# Patient Record
Sex: Female | Born: 1985 | Race: Black or African American | Hispanic: No | Marital: Single | Smoking: Never smoker
Health system: Southern US, Community
[De-identification: ages and names within clinical notes are randomized; demographics above are authoritative.]

## PROBLEM LIST (undated history)

## (undated) ENCOUNTER — Inpatient Hospital Stay (HOSPITAL_COMMUNITY): Payer: Self-pay

## (undated) DIAGNOSIS — Z331 Pregnant state, incidental: Secondary | ICD-10-CM

## (undated) DIAGNOSIS — Z8619 Personal history of other infectious and parasitic diseases: Secondary | ICD-10-CM

## (undated) HISTORY — DX: Personal history of other infectious and parasitic diseases: Z86.19

## (undated) HISTORY — PX: NO PAST SURGERIES: SHX2092

---
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
## 2006-08-06 ENCOUNTER — Emergency Department (HOSPITAL_COMMUNITY): Admission: EM | Admit: 2006-08-06 | Discharge: 2006-08-06 | Payer: Self-pay | Admitting: Emergency Medicine

## 2006-09-05 ENCOUNTER — Emergency Department (HOSPITAL_COMMUNITY): Admission: EM | Admit: 2006-09-05 | Discharge: 2006-09-05 | Payer: Self-pay | Admitting: Emergency Medicine

## 2007-01-12 ENCOUNTER — Emergency Department (HOSPITAL_COMMUNITY): Admission: EM | Admit: 2007-01-12 | Discharge: 2007-01-12 | Payer: Self-pay | Admitting: Emergency Medicine

## 2007-01-13 ENCOUNTER — Emergency Department (HOSPITAL_COMMUNITY): Admission: EM | Admit: 2007-01-13 | Discharge: 2007-01-13 | Payer: Self-pay | Admitting: Emergency Medicine

## 2007-01-24 ENCOUNTER — Emergency Department (HOSPITAL_COMMUNITY): Admission: EM | Admit: 2007-01-24 | Discharge: 2007-01-24 | Payer: Self-pay | Admitting: Emergency Medicine

## 2007-04-28 ENCOUNTER — Emergency Department (HOSPITAL_COMMUNITY): Admission: EM | Admit: 2007-04-28 | Discharge: 2007-04-28 | Payer: Self-pay | Admitting: Family Medicine

## 2007-07-27 ENCOUNTER — Inpatient Hospital Stay (HOSPITAL_COMMUNITY): Admission: AD | Admit: 2007-07-27 | Discharge: 2007-07-27 | Payer: Self-pay | Admitting: Obstetrics and Gynecology

## 2007-08-02 ENCOUNTER — Emergency Department (HOSPITAL_COMMUNITY): Admission: EM | Admit: 2007-08-02 | Discharge: 2007-08-02 | Payer: Self-pay | Admitting: Emergency Medicine

## 2007-08-12 ENCOUNTER — Inpatient Hospital Stay (HOSPITAL_COMMUNITY): Admission: AD | Admit: 2007-08-12 | Discharge: 2007-08-12 | Payer: Self-pay | Admitting: Gynecology

## 2007-10-16 ENCOUNTER — Inpatient Hospital Stay (HOSPITAL_COMMUNITY): Admission: AD | Admit: 2007-10-16 | Discharge: 2007-10-17 | Payer: Self-pay | Admitting: Obstetrics & Gynecology

## 2007-12-13 ENCOUNTER — Inpatient Hospital Stay (HOSPITAL_COMMUNITY): Admission: AD | Admit: 2007-12-13 | Discharge: 2007-12-13 | Payer: Self-pay | Admitting: Obstetrics and Gynecology

## 2007-12-28 ENCOUNTER — Emergency Department (HOSPITAL_COMMUNITY): Admission: EM | Admit: 2007-12-28 | Discharge: 2007-12-28 | Payer: Self-pay | Admitting: Emergency Medicine

## 2008-01-23 ENCOUNTER — Inpatient Hospital Stay (HOSPITAL_COMMUNITY): Admission: AD | Admit: 2008-01-23 | Discharge: 2008-01-23 | Payer: Self-pay | Admitting: Obstetrics and Gynecology

## 2008-03-14 ENCOUNTER — Inpatient Hospital Stay (HOSPITAL_COMMUNITY): Admission: AD | Admit: 2008-03-14 | Discharge: 2008-03-15 | Payer: Self-pay | Admitting: Obstetrics and Gynecology

## 2008-03-20 ENCOUNTER — Inpatient Hospital Stay (HOSPITAL_COMMUNITY): Admission: AD | Admit: 2008-03-20 | Discharge: 2008-03-21 | Payer: Self-pay | Admitting: Obstetrics and Gynecology

## 2008-03-22 ENCOUNTER — Inpatient Hospital Stay (HOSPITAL_COMMUNITY): Admission: AD | Admit: 2008-03-22 | Discharge: 2008-03-23 | Payer: Self-pay | Admitting: Obstetrics and Gynecology

## 2008-03-24 ENCOUNTER — Inpatient Hospital Stay (HOSPITAL_COMMUNITY): Admission: RE | Admit: 2008-03-24 | Discharge: 2008-03-26 | Payer: Self-pay | Admitting: Obstetrics and Gynecology

## 2008-08-26 ENCOUNTER — Emergency Department (HOSPITAL_COMMUNITY): Admission: EM | Admit: 2008-08-26 | Discharge: 2008-08-26 | Payer: Self-pay | Admitting: Family Medicine

## 2009-06-20 ENCOUNTER — Emergency Department (HOSPITAL_COMMUNITY): Admission: EM | Admit: 2009-06-20 | Discharge: 2009-06-20 | Payer: Self-pay | Admitting: Emergency Medicine

## 2009-08-04 ENCOUNTER — Emergency Department (HOSPITAL_COMMUNITY): Admission: EM | Admit: 2009-08-04 | Discharge: 2009-08-04 | Payer: Self-pay | Admitting: Emergency Medicine

## 2009-12-12 ENCOUNTER — Emergency Department (HOSPITAL_COMMUNITY): Admission: EM | Admit: 2009-12-12 | Discharge: 2009-12-12 | Payer: Self-pay | Admitting: Family Medicine

## 2010-03-28 ENCOUNTER — Emergency Department (HOSPITAL_COMMUNITY): Admission: EM | Admit: 2010-03-28 | Discharge: 2010-03-28 | Payer: Self-pay | Admitting: Emergency Medicine

## 2010-04-04 ENCOUNTER — Ambulatory Visit: Payer: Self-pay | Admitting: Nurse Practitioner

## 2010-04-04 ENCOUNTER — Inpatient Hospital Stay (HOSPITAL_COMMUNITY): Admission: AD | Admit: 2010-04-04 | Discharge: 2010-04-04 | Payer: Self-pay | Admitting: Obstetrics and Gynecology

## 2010-07-21 ENCOUNTER — Ambulatory Visit: Payer: Self-pay | Admitting: Family

## 2010-07-21 ENCOUNTER — Inpatient Hospital Stay (HOSPITAL_COMMUNITY): Admission: AD | Admit: 2010-07-21 | Discharge: 2010-07-21 | Payer: Self-pay | Admitting: Obstetrics & Gynecology

## 2010-12-06 LAB — URINALYSIS, ROUTINE W REFLEX MICROSCOPIC
Glucose, UA: NEGATIVE mg/dL
Leukocytes, UA: NEGATIVE
pH: 6.5 (ref 5.0–8.0)

## 2010-12-06 LAB — URINE MICROSCOPIC-ADD ON

## 2010-12-06 LAB — CBC
HCT: 40.5 % (ref 36.0–46.0)
Hemoglobin: 13.4 g/dL (ref 12.0–15.0)
MCV: 88.5 fL (ref 78.0–100.0)
RBC: 4.57 MIL/uL (ref 3.87–5.11)
WBC: 6.4 10*3/uL (ref 4.0–10.5)

## 2010-12-06 LAB — POCT PREGNANCY, URINE: Preg Test, Ur: POSITIVE

## 2010-12-06 LAB — GC/CHLAMYDIA PROBE AMP, GENITAL: Chlamydia, DNA Probe: NEGATIVE

## 2010-12-06 LAB — ABO/RH: ABO/RH(D): A POS

## 2010-12-10 LAB — URINALYSIS, ROUTINE W REFLEX MICROSCOPIC
Glucose, UA: NEGATIVE mg/dL
Ketones, ur: NEGATIVE mg/dL
Nitrite: NEGATIVE
Protein, ur: NEGATIVE mg/dL
Specific Gravity, Urine: 1.01 (ref 1.005–1.030)
Specific Gravity, Urine: 1.022 (ref 1.005–1.030)
Urobilinogen, UA: 1 mg/dL (ref 0.0–1.0)
pH: 7 (ref 5.0–8.0)

## 2010-12-10 LAB — ABO/RH: ABO/RH(D): A POS

## 2010-12-10 LAB — CBC
HCT: 40.6 % (ref 36.0–46.0)
MCHC: 33.7 g/dL (ref 30.0–36.0)
Platelets: 252 10*3/uL (ref 150–400)
RDW: 14.3 % (ref 11.5–15.5)

## 2010-12-10 LAB — WET PREP, GENITAL
Trich, Wet Prep: NONE SEEN
Yeast Wet Prep HPF POC: NONE SEEN

## 2010-12-10 LAB — URINE MICROSCOPIC-ADD ON

## 2010-12-10 LAB — URINE CULTURE: Colony Count: NO GROWTH

## 2010-12-10 LAB — POCT PREGNANCY, URINE: Preg Test, Ur: POSITIVE

## 2010-12-27 LAB — URINALYSIS, ROUTINE W REFLEX MICROSCOPIC
Glucose, UA: NEGATIVE mg/dL
Hgb urine dipstick: NEGATIVE
pH: 6.5 (ref 5.0–8.0)

## 2010-12-27 LAB — URINE MICROSCOPIC-ADD ON

## 2010-12-29 LAB — POCT URINALYSIS DIP (DEVICE)
Bilirubin Urine: NEGATIVE
Hgb urine dipstick: NEGATIVE
Specific Gravity, Urine: 1.02 (ref 1.005–1.030)
pH: 7 (ref 5.0–8.0)

## 2011-01-15 ENCOUNTER — Emergency Department (HOSPITAL_COMMUNITY)
Admission: EM | Admit: 2011-01-15 | Discharge: 2011-01-16 | Disposition: A | Discharge status: Home / Self Care | Payer: Medicaid Other | Attending: Emergency Medicine | Admitting: Emergency Medicine

## 2011-01-15 DIAGNOSIS — R350 Frequency of micturition: Secondary | ICD-10-CM | POA: Insufficient documentation

## 2011-01-15 DIAGNOSIS — R51 Headache: Secondary | ICD-10-CM | POA: Insufficient documentation

## 2011-01-15 DIAGNOSIS — R631 Polydipsia: Secondary | ICD-10-CM | POA: Insufficient documentation

## 2011-01-15 LAB — URINE MICROSCOPIC-ADD ON

## 2011-01-15 LAB — URINALYSIS, ROUTINE W REFLEX MICROSCOPIC
Ketones, ur: NEGATIVE mg/dL
Nitrite: NEGATIVE
Specific Gravity, Urine: 1.019 (ref 1.005–1.030)
pH: 6.5 (ref 5.0–8.0)

## 2011-01-15 LAB — POCT PREGNANCY, URINE: Preg Test, Ur: NEGATIVE

## 2011-02-06 NOTE — Discharge Summary (Signed)
Judy Tate, Judy Tate              ACCOUNT NO.:  [id number]   MEDICAL RECORD NO.:  [id number]          PATIENT TYPE:  INP   LOCATION:  9123                          FACILITY:  [facility]   PHYSICIAN:  Sherron Monday, MD        DATE OF BIRTH:  24-Jan-1986   DATE OF ADMISSION:  03/24/2008  DATE OF DISCHARGE:  03/26/2008                               DISCHARGE SUMMARY   ADMITTING DIAGNOSIS:  Intrauterine pregnancy at term, for induction of  labor.   DISCHARGE DIAGNOSIS:  Infant delivered via spontaneous vaginal delivery.   HISTORY OF PRESENT ILLNESS:  A 25 year old G2, P1-0-0-1 at 15 plus weeks  with an [facility] of March 29, 2008, by last menstrual period consistent with 19-  week ultrasound presents to labor and delivery for induction given term  status and favorable cervix.  Prenatal care was somewhat late and  starting at 19 to 22 weeks, but otherwise uneventful.   PAST MEDICAL HISTORY:  Nonsignificant.   PAST SURGICAL HISTORY:  Nonsignificant.   PAST OB/GYN HISTORY:  G1 was a term delivery in 2007, 5 pound 2 ounce  infant.  G2 is her current pregnancy.  History of an abnormal Pap smear  that has since resolved.  No history of any sexual transmitted diseases.   MEDICATIONS:  Include prenatal vitamins.   ALLERGIES:  No known drug allergies.   SOCIAL HISTORY:  The patient denies alcohol, tobacco, or drug use.   PRENATAL LABS:  A positive.  Antibody screen negative.  Sickle cell  within normal limits, RPR nonreactive, rubella immune, Hepatitis C  surface antigen negative, HIV negative.  Gonorrhea negative and  Chlamydia negative.  Group B Strep negative.  One-hour Glucola 59.  She  was too late for any screens.   PHYSICAL EXAM:  On admission, she was afebrile.  Vital signs were stable  and a benign exam.  Her cervix was found to be 2+ cm dilated, 50%  effaced, and -2 station.  Her membranes were ruptured from mild-to-  moderate meconium and started on Pitocin to augment her labor.  Her  intrapartum course was relatively uncomplicated.  An IUPC was placed for  an amnio-infusion secondary to the meconium.  She progressed to complete  +2, pushed well for a vaginal delivery of a vigorous female infant over  an intact perineum with Apgars of 8 at one minute and 9 at five minutes,  was DeLee suctioned on the perineum for a light meconium fluid and  weight is 7 pounds 1 ounces.  Placenta was delivered intact and  spontaneously with a small vaginal abrasion, which was repaired with 2-0  Vicryl.  Cervix and rectum were intact.  The patient's EBL was  approximately 450 mL.  Her postpartum course was relatively  uncomplicated.  She remained afebrile with vitals stable throughout.  Her fundus was firm and she was without complaints.  She was discharged  home on postpartum day #2, ambulating well, and tolerating diet without  any problems.  She was discharged to home with routine discharge  instructions and is to call with any questions or  problems  with prescriptions for Motrin, Vicodin, and prenatal vitamins.  She plans to breastfeed and bottle feed.  Her hemoglobin decreased from  11.0 to 9.7.  She will discuss her contraception with Dr. Senaida Ores at  her postpartum checkup in 6 weeks.      Sherron Monday, MD  Electronically Signed     JB/MEDQ  D:  03/26/2008  T:  03/27/2008  Job:  [id number]

## 2011-02-11 ENCOUNTER — Inpatient Hospital Stay (HOSPITAL_COMMUNITY)
Admission: AD | Admit: 2011-02-11 | Discharge: 2011-02-11 | Disposition: A | Discharge status: Home / Self Care | Payer: Medicaid Other | Source: Ambulatory Visit | Attending: Obstetrics & Gynecology | Admitting: Obstetrics & Gynecology

## 2011-02-11 DIAGNOSIS — O99891 Other specified diseases and conditions complicating pregnancy: Secondary | ICD-10-CM | POA: Insufficient documentation

## 2011-02-11 DIAGNOSIS — J40 Bronchitis, not specified as acute or chronic: Secondary | ICD-10-CM | POA: Insufficient documentation

## 2011-02-11 DIAGNOSIS — O9989 Other specified diseases and conditions complicating pregnancy, childbirth and the puerperium: Secondary | ICD-10-CM

## 2011-02-19 ENCOUNTER — Inpatient Hospital Stay (HOSPITAL_COMMUNITY): Payer: Medicaid Other

## 2011-02-19 ENCOUNTER — Inpatient Hospital Stay (HOSPITAL_COMMUNITY)
Admission: EM | Admit: 2011-02-19 | Discharge: 2011-02-19 | Disposition: A | Discharge status: Home / Self Care | Payer: Medicaid Other | Source: Ambulatory Visit | Attending: Obstetrics and Gynecology | Admitting: Obstetrics and Gynecology

## 2011-02-19 ENCOUNTER — Encounter (HOSPITAL_COMMUNITY): Payer: Self-pay | Admitting: Radiology

## 2011-02-19 DIAGNOSIS — O209 Hemorrhage in early pregnancy, unspecified: Secondary | ICD-10-CM | POA: Insufficient documentation

## 2011-02-19 DIAGNOSIS — R58 Hemorrhage, not elsewhere classified: Secondary | ICD-10-CM

## 2011-02-19 LAB — WET PREP, GENITAL: Clue Cells Wet Prep HPF POC: NONE SEEN

## 2011-02-19 LAB — CBC
Hemoglobin: 12.3 g/dL (ref 12.0–15.0)
MCH: 27.6 pg (ref 26.0–34.0)
RBC: 4.46 MIL/uL (ref 3.87–5.11)

## 2011-02-19 LAB — ABO/RH: ABO/RH(D): A POS

## 2011-02-20 LAB — GC/CHLAMYDIA PROBE AMP, GENITAL: Chlamydia, DNA Probe: NEGATIVE

## 2011-02-21 ENCOUNTER — Inpatient Hospital Stay (HOSPITAL_COMMUNITY)
Admission: AD | Admit: 2011-02-21 | Discharge: 2011-02-21 | Disposition: A | Discharge status: Home / Self Care | Payer: Medicaid Other | Source: Ambulatory Visit | Attending: Obstetrics and Gynecology | Admitting: Obstetrics and Gynecology

## 2011-02-21 DIAGNOSIS — O039 Complete or unspecified spontaneous abortion without complication: Secondary | ICD-10-CM | POA: Insufficient documentation

## 2011-02-21 LAB — HCG, QUANTITATIVE, PREGNANCY: hCG, Beta Chain, Quant, S: 34 m[IU]/mL — ABNORMAL HIGH (ref <5)

## 2011-05-06 IMAGING — US US OB COMP LESS 14 WK
1 series · 14 of 28 positions shown · non-contrast
Comparison: ultrasound 04/04/2010

CLINICAL DATA: Bleeding, estimated gestational age by last
menstrual period equals 7 weeks 0 days.

OBSTETRIC <14 WK ULTRASOUND
TECHNIQUE: Transabdominal ultrasound was performed for evaluation
of the gestation as well as the maternal uterus and adnexal
regions.

[Series 1: us ob comp less 14 wks · 0.17mm/px · 32 acquisitions, 14 frames shown]
[im 2/32]
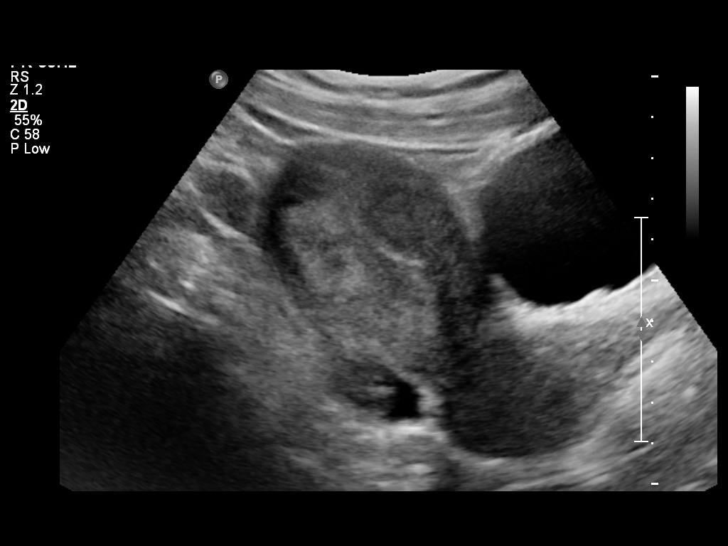
[im 4/32]
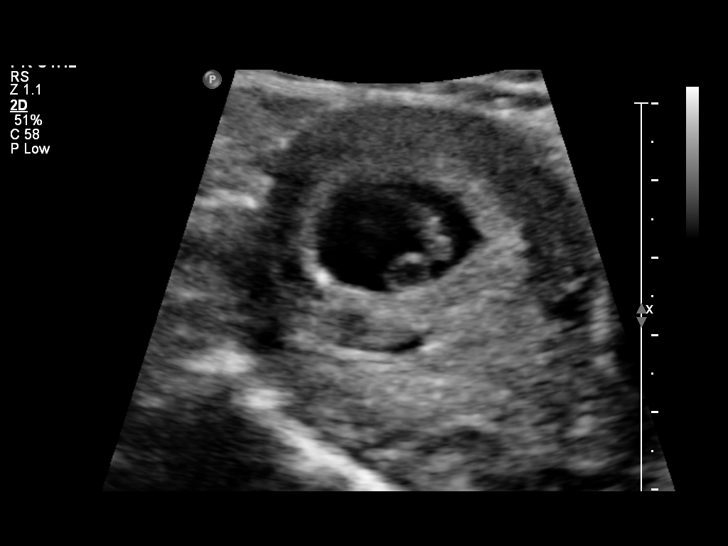
[im 6/32]
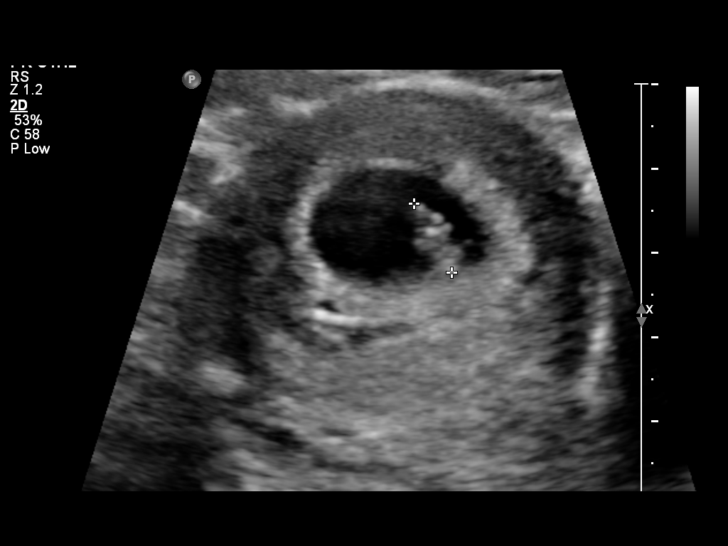
[im 9/32]
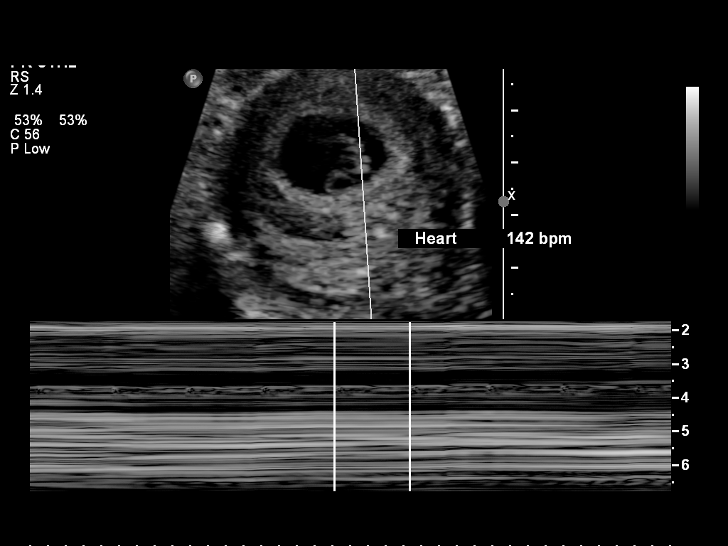
[im 11/32]
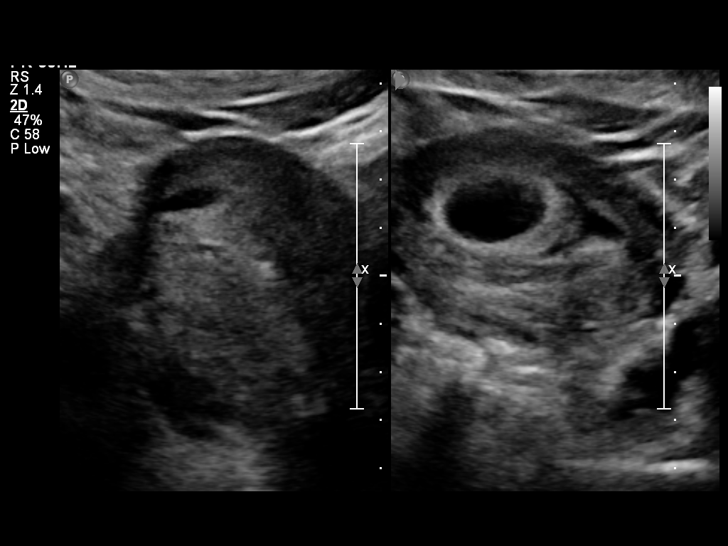
[im 13/32]
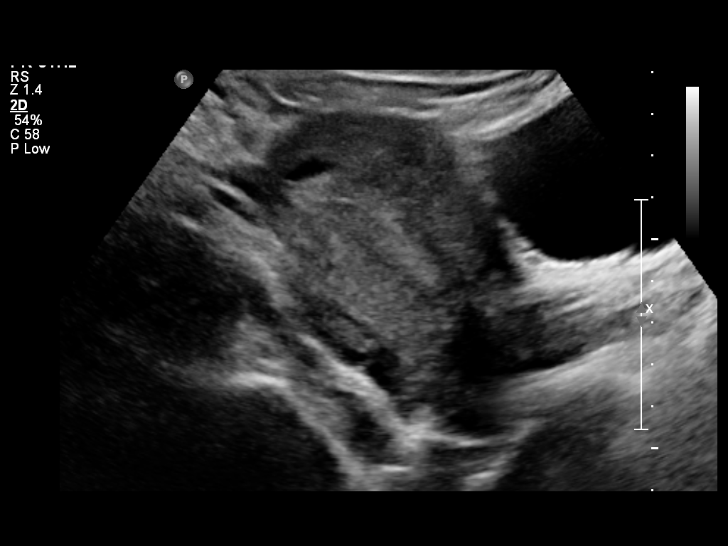
[im 15/32]
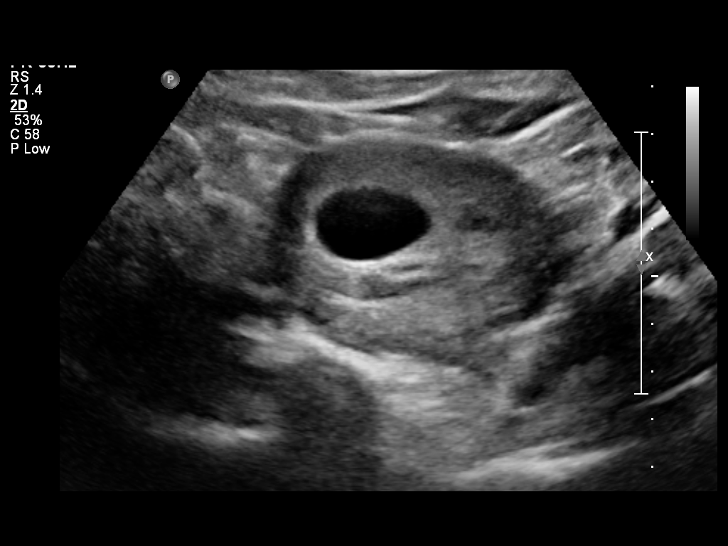
[im 18/32]
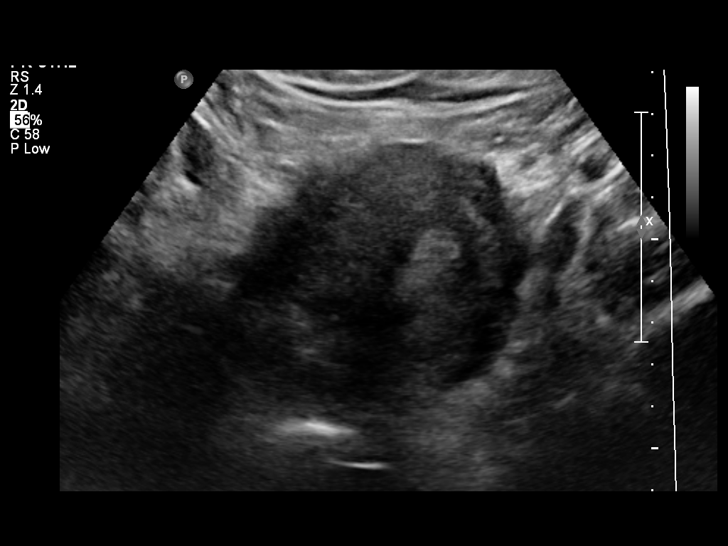
[im 20/32]
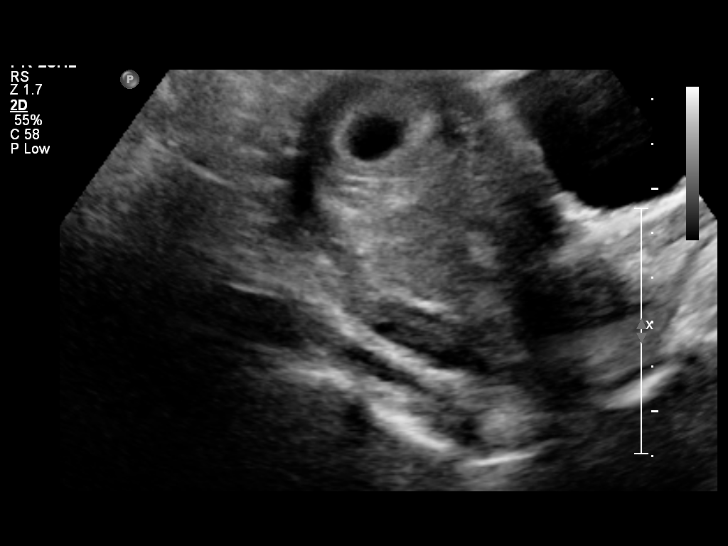
[im 22/32]
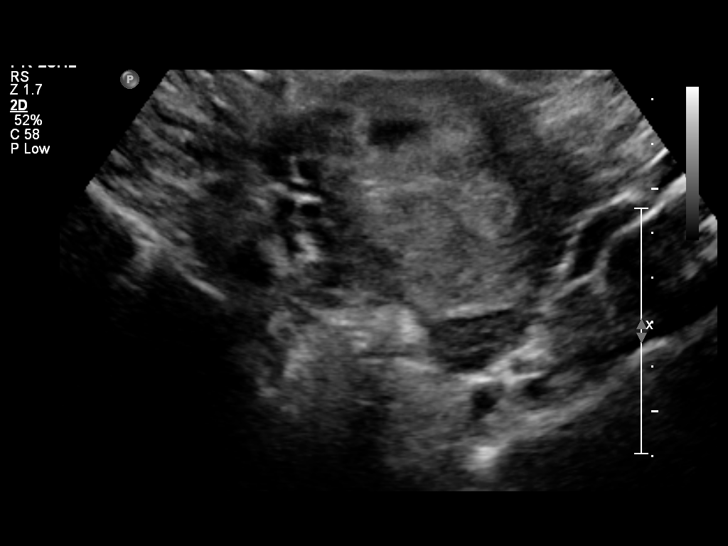
[im 25/32]
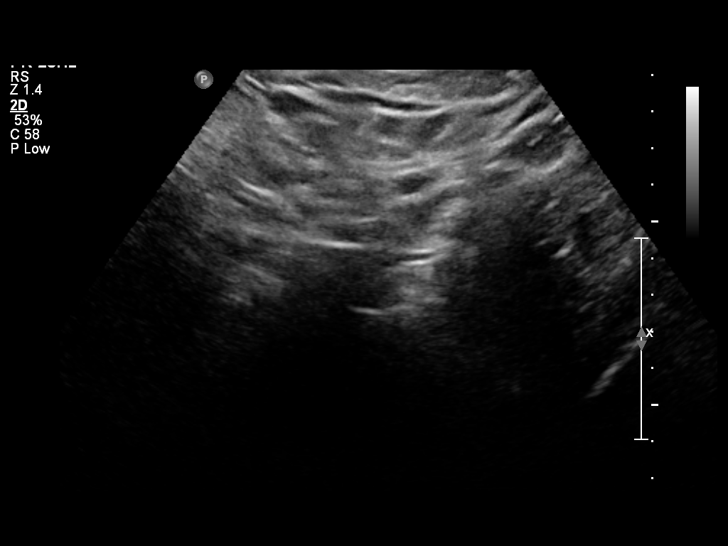
[im 27/32]
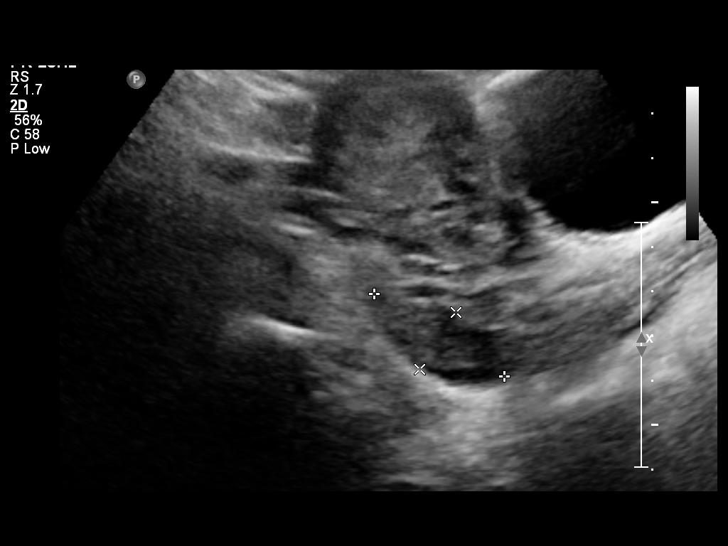
[im 29/32]
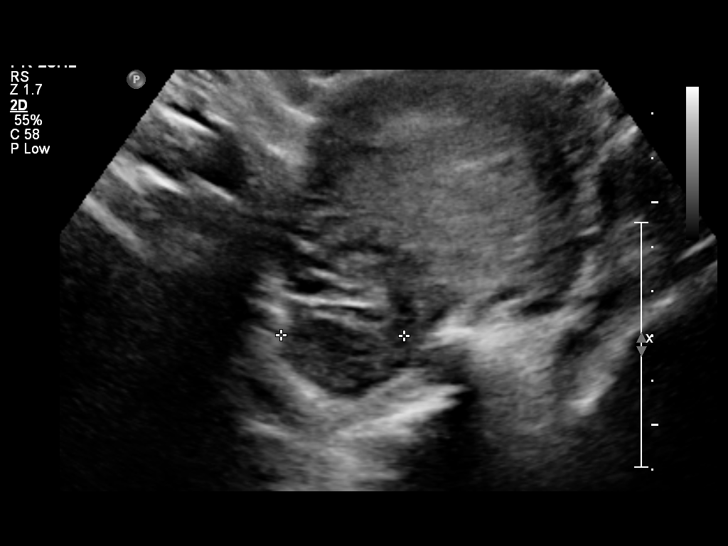
[im 32/32]
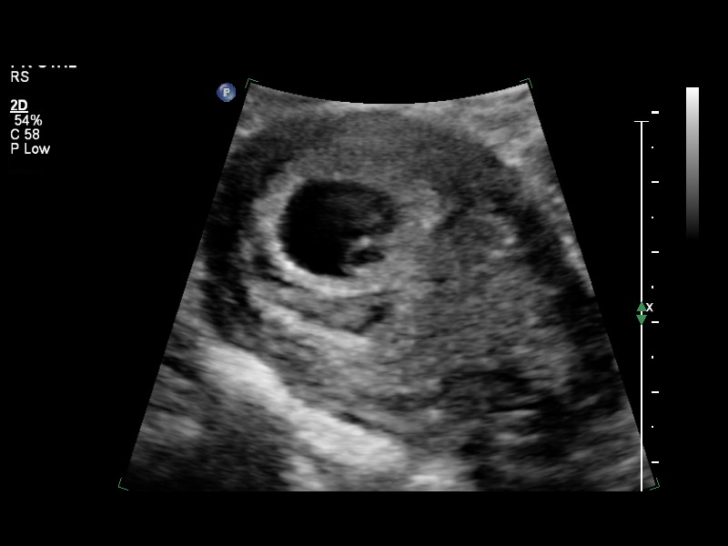

[14 of 28 positions shown; findings below may reference images not displayed]

Intrauterine gestational sac: single
Yolk sac: Present
Embryo: Present
Cardiac Activity: Present
Heart Rate: 142 bpm

CRL:  10.3 mm  7w  1d         US EDC: 03/09/2011

Maternal uterus/Adnexae:
Small subchorionic hemorrhage is noted.  Corpus luteal cyst in the
right ovary.  Left ovary is normal.  No free fluid.
IMPRESSION: 1.  Single intrauterine gestational sac with embryo and normal
cardiac activity.
2.  Small subchorionic hemorrhage.
3.  Estimated gestational age by crown-rump length equals 7 weeks 1
day.

## 2011-06-18 ENCOUNTER — Inpatient Hospital Stay (HOSPITAL_COMMUNITY)
Admission: AD | Admit: 2011-06-18 | Discharge: 2011-06-18 | Disposition: A | Discharge status: Home / Self Care | Payer: Medicaid Other | Source: Ambulatory Visit | Attending: Obstetrics and Gynecology | Admitting: Obstetrics and Gynecology

## 2011-06-18 ENCOUNTER — Encounter (HOSPITAL_COMMUNITY): Payer: Self-pay | Admitting: *Deleted

## 2011-06-18 DIAGNOSIS — N644 Mastodynia: Secondary | ICD-10-CM | POA: Insufficient documentation

## 2011-06-18 LAB — URINALYSIS, ROUTINE W REFLEX MICROSCOPIC
Bilirubin Urine: NEGATIVE
Glucose, UA: NEGATIVE
Ketones, ur: NEGATIVE
Nitrite: NEGATIVE
Protein, ur: NEGATIVE
pH: 7

## 2011-06-18 NOTE — Progress Notes (Signed)
Pt seen leaving by another employee.  Pt left after having blood drawn.  No vitals or e-sing done.

## 2011-06-18 NOTE — ED Provider Notes (Signed)
History   The patient is a 25 y.o. year old G63P2012 female who presents to MAU reporting sore breast x 2 weeks. Her LMP on 06/03/11 was short and light. She thinks she may be pregnant and is very anxious because she had an SAB in may 2012 and because her mother had breast cancer. She denies breast masses, nipple retraction or discharge or cyclic nature to her breast soreness.   CSN: [id number] Arrival date & time: 06/18/2011  3:26 PM  Chief Complaint  Patient presents with  . Breast Pain    HPI  (Consider location/radiation/quality/duration/timing/severity/associated sxs/prior treatment)  HPI  Past Medical History  Diagnosis Date  . No pertinent past medical history     Past Surgical History  Procedure Date  . No past surgeries     No family history on file.  History  Substance Use Topics  . Smoking status: Never Smoker   . Smokeless tobacco: Not on file  . Alcohol Use: No    OB History    Grav Para Term Preterm Abortions TAB SAB Ect Mult Living   4 2 2  0 1 0 1 0 0 2      Review of Systems  Review of Systems: Otherwise neg  Allergies  Review of patient's allergies indicates no known allergies.  Home Medications  No current outpatient prescriptions on file.  Physical Exam    BP 112/62  Pulse 61  Temp(Src) 99 F (37.2 C) (Oral)  Resp 16  Ht 5' 3.5" (1.613 m)  Wt 77.111 kg (170 lb)  BMI 29.64 kg/m2  LMP 06/03/2011  Breastfeeding? No  Physical Exam Breast: Skin smooth, no masses. Nipples erect, no discharge. No lymphadenopathy. Nipples mildly tender to palpation.  Results for orders placed during the hospital encounter of 06/18/11 (from the past 24 hour(s))  POCT PREGNANCY, URINE     Status: Normal   Collection Time   06/18/11  5:12 PM      Component Value Range   Preg Test, Ur NEGATIVE    HCG, QUANTITATIVE, PREGNANCY     Status: Normal   Collection Time   06/18/11 10:45 PM      Component Value Range   hCG, Beta Chain, Quant, S 1  <5 (mIU/mL)     ED Course  Procedures (including critical care time)   Labs Reviewed  POCT PREGNANCY, URINE  POCT URINE PREGNANCY  HCG, QUANTITATIVE, PREGNANCY   Assessment: 1. Breast tenderness of unknown etiology. Normal exam.  Plan: 1. Pt left w/out paperwork 2. Called w/ results 3. Dr. Senaida Ores notified of visit and results. 4. F/U PRN for worsening Sx.

## 2011-06-18 NOTE — Progress Notes (Signed)
V. Smith, CNM at bedside.  Assessment done and poc discussed with pt.  

## 2011-06-18 NOTE — Progress Notes (Signed)
Pt states, " I had an unusual 2 days period this month and now my breast are sore."

## 2011-06-18 NOTE — Progress Notes (Signed)
NP explained to pt that she could get dressed and leave after lab work done and paper work signed.  Entered room to see pt after and she was gone.  Other employee had seen pt ambulating out with no s/s of distress.

## 2011-06-19 NOTE — Progress Notes (Signed)
Called beta result to pt per CNM request.

## 2011-06-21 LAB — CBC
HCT: 29.6 — ABNORMAL LOW
HCT: 33.4 — ABNORMAL LOW
Hemoglobin: 9.7 — ABNORMAL LOW
MCHC: 32.9
MCHC: 33
MCV: 79
Platelets: 240
RBC: 4.23
RDW: 15.6 — ABNORMAL HIGH
WBC: 7.6

## 2011-07-03 LAB — POCT PREGNANCY, URINE
Operator id: 271521
Preg Test, Ur: POSITIVE

## 2011-07-03 LAB — URINE MICROSCOPIC-ADD ON

## 2011-07-03 LAB — HERPES SIMPLEX VIRUS CULTURE: Culture: DETECTED

## 2011-07-03 LAB — WET PREP, GENITAL
Clue Cells Wet Prep HPF POC: NONE SEEN
Trich, Wet Prep: NONE SEEN

## 2011-07-03 LAB — URINALYSIS, ROUTINE W REFLEX MICROSCOPIC
Bilirubin Urine: NEGATIVE
Bilirubin Urine: NEGATIVE
Glucose, UA: NEGATIVE
Glucose, UA: NEGATIVE
Hgb urine dipstick: NEGATIVE
Hgb urine dipstick: NEGATIVE
Ketones, ur: 15 — AB
Ketones, ur: 15 — AB
Ketones, ur: NEGATIVE
Leukocytes, UA: NEGATIVE
Protein, ur: NEGATIVE
Protein, ur: NEGATIVE
Urobilinogen, UA: 0.2
pH: 7.5

## 2011-07-03 LAB — GC/CHLAMYDIA PROBE AMP, GENITAL: Chlamydia, DNA Probe: NEGATIVE

## 2011-07-09 LAB — POCT URINALYSIS DIP (DEVICE)
Hgb urine dipstick: NEGATIVE
Protein, ur: 100 — AB
Specific Gravity, Urine: >=1.03
Urobilinogen, UA: 2 — ABNORMAL HIGH
pH: 6

## 2011-07-09 LAB — HERPES SIMPLEX VIRUS CULTURE

## 2011-07-09 LAB — WET PREP, GENITAL: Clue Cells Wet Prep HPF POC: NONE SEEN

## 2011-08-20 ENCOUNTER — Inpatient Hospital Stay (HOSPITAL_COMMUNITY): Payer: Medicaid Other

## 2011-08-20 ENCOUNTER — Encounter (HOSPITAL_COMMUNITY): Payer: Self-pay | Admitting: *Deleted

## 2011-08-20 ENCOUNTER — Inpatient Hospital Stay (HOSPITAL_COMMUNITY)
Admission: AD | Admit: 2011-08-20 | Discharge: 2011-08-20 | Disposition: A | Discharge status: Home / Self Care | Payer: Medicaid Other | Source: Ambulatory Visit | Attending: Obstetrics and Gynecology | Admitting: Obstetrics and Gynecology

## 2011-08-20 DIAGNOSIS — Z363 Encounter for antenatal screening for malformations: Secondary | ICD-10-CM

## 2011-08-20 DIAGNOSIS — O99891 Other specified diseases and conditions complicating pregnancy: Secondary | ICD-10-CM | POA: Insufficient documentation

## 2011-08-20 DIAGNOSIS — R109 Unspecified abdominal pain: Secondary | ICD-10-CM | POA: Insufficient documentation

## 2011-08-20 DIAGNOSIS — Z3490 Encounter for supervision of normal pregnancy, unspecified, unspecified trimester: Secondary | ICD-10-CM

## 2011-08-20 DIAGNOSIS — Z1389 Encounter for screening for other disorder: Secondary | ICD-10-CM

## 2011-08-20 LAB — HCG, QUANTITATIVE, PREGNANCY: hCG, Beta Chain, Quant, S: 47397 m[IU]/mL — ABNORMAL HIGH (ref <5)

## 2011-08-20 NOTE — ED Provider Notes (Signed)
Took over care of patient at 8:15am. From Wynelle Bourgeois. Patient was in ultrasound. Patient returned from ultrasound and has a 6 week 6 day IUP with FH of 153.  I spoke with Dr. Senaida Ores and the patient may be discharged home to follow up in the office.  [place], NP 08/20/11 1009

## 2011-08-20 NOTE — Progress Notes (Signed)
PT SAYS  LAST WEEK SHE  STARTED HAVING CRAMPS-  SAME NOW- AND RADIATES DOWN LEGS.  . WAS SEEN IN OFFICE IN NOV- AT 5 WEEKS.- DID UPT.   NEXT APPOINTMENT IS 08-30-2011. DID NOT CALL DR.Marland Kitchen  HAD LIGHT CYCLE ON 06-28-2011-  . CAME HERE FOR SORE BREAST  BEFORE OCT-  WE  SAID NOT PREG.

## 2011-08-20 NOTE — Progress Notes (Signed)
UNSURE TO COUNT LMP AS 06-03-2011 OR 06-28-2011

## 2011-08-20 NOTE — ED Provider Notes (Signed)
History     No chief complaint on file. Cramping, pregnant  HPI This is a 25 y.o. patient at [redacted]w[redacted]d who presents with c/o cramping. Denies bleeding. Did not call Dr Senaida Ores. Wants to make sure the baby is OK.  OB History    Grav Para Term Preterm Abortions TAB SAB Ect Mult Living   4 0 0 0 1 0 1 0 0 2       Past Medical History  Diagnosis Date  . No pertinent past medical history     Past Surgical History  Procedure Date  . No past surgeries     Family History  Problem Relation Age of Onset  . Diabetes Maternal Grandmother     History  Substance Use Topics  . Smoking status: Never Smoker   . Smokeless tobacco: Never Used  . Alcohol Use: No    Allergies: No Known Allergies  Prescriptions prior to admission  Medication Sig Dispense Refill  . prenatal vitamin w/FE, FA (PRENATAL 1 + 1) 27-1 MG TABS Take 1 tablet by mouth daily.          ROS  See above.  Physical Exam   Blood pressure 108/51, pulse 86, temperature 98.4 F (36.9 C), temperature source Oral, resp. rate 18, height 5\' 3"  (1.6 m), weight 176 lb 2 oz (79.89 kg), last menstrual period 06/03/2011.  Physical Exam No apparent distress. Does not appear uncomfortable. HR regular Resp: unlabored Abdomen:  Soft and nontender Pelvic exam:  No blood in vagina                       Cervix long and closed                       Uterus 6-7 week size Adnexae nontender  [place] ordered with Quant HCG Cultures done  MAU Course  Procedures   Assessment and Plan  Spoke with Dr Senaida Ores, will proceed with Quant and [place]. Report to [facility] NP  [facility] 08/20/2011, 8:20 AM

## 2011-09-03 LAB — OB RESULTS CONSOLE HEPATITIS B SURFACE ANTIGEN: Hepatitis B Surface Ag: NEGATIVE

## 2011-09-03 LAB — OB RESULTS CONSOLE HIV ANTIBODY (ROUTINE TESTING): HIV: NONREACTIVE

## 2011-09-03 LAB — OB RESULTS CONSOLE RUBELLA ANTIBODY, IGM: Rubella: IMMUNE

## 2011-09-03 LAB — OB RESULTS CONSOLE GC/CHLAMYDIA: Chlamydia: NEGATIVE

## 2011-09-03 LAB — OB RESULTS CONSOLE ANTIBODY SCREEN: Antibody Screen: NEGATIVE

## 2011-09-03 LAB — OB RESULTS CONSOLE RPR: RPR: NONREACTIVE

## 2011-09-13 ENCOUNTER — Encounter (HOSPITAL_COMMUNITY): Payer: Self-pay | Admitting: *Deleted

## 2011-09-13 ENCOUNTER — Inpatient Hospital Stay (HOSPITAL_COMMUNITY)
Admission: AD | Admit: 2011-09-13 | Discharge: 2011-09-14 | Disposition: A | Discharge status: Home / Self Care | Payer: Medicaid Other | Source: Ambulatory Visit | Attending: Obstetrics and Gynecology | Admitting: Obstetrics and Gynecology

## 2011-09-13 DIAGNOSIS — O219 Vomiting of pregnancy, unspecified: Secondary | ICD-10-CM

## 2011-09-13 DIAGNOSIS — O210 Mild hyperemesis gravidarum: Secondary | ICD-10-CM | POA: Insufficient documentation

## 2011-09-13 LAB — URINALYSIS, ROUTINE W REFLEX MICROSCOPIC
Nitrite: NEGATIVE
Protein, ur: NEGATIVE mg/dL
Specific Gravity, Urine: 1.02 (ref 1.005–1.030)
Urobilinogen, UA: 0.2 mg/dL (ref 0.0–1.0)

## 2011-09-13 MED ORDER — ONDANSETRON HCL 4 MG/2ML IJ SOLN
4.0000 mg | Freq: Once | INTRAMUSCULAR | Status: AC
  Administered 2011-09-13: 4 mg via INTRAVENOUS
  Filled 2011-09-13: qty 2

## 2011-09-13 MED ORDER — LACTATED RINGERS IV BOLUS (SEPSIS)
1000.0000 mL | Freq: Once | INTRAVENOUS | Status: AC
  Administered 2011-09-13: 1000 mL via INTRAVENOUS

## 2011-09-13 NOTE — Progress Notes (Signed)
OFFICE GAVE HER EDC 04-03-12

## 2011-09-13 NOTE — Progress Notes (Signed)
SAYS HAS BEEN VOMITING X  2 WEEKS -  CALLED OFFICE - TOLD TO COME IN-  NO MEDS AT HOME.  WAS IN OFFICE ON 12-10- FOR OB WORKUP.     VOMITED TODAY-  5 X.

## 2011-09-13 NOTE — ED Provider Notes (Signed)
History     Chief Complaint  Patient presents with  . Morning Sickness   HPI 25 y.o. [id number] at [redacted]w[redacted]d with n/v. States not able to keep anything down x 2-3 weeks. Not taking any meds.     Past Medical History  Diagnosis Date  . No pertinent past medical history     Past Surgical History  Procedure Date  . No past surgeries     Family History  Problem Relation Age of Onset  . Diabetes Maternal Grandmother   . Anesthesia problems Neg Hx   . Cancer Mother     History  Substance Use Topics  . Smoking status: Never Smoker   . Smokeless tobacco: Never Used  . Alcohol Use: No    Allergies: No Known Allergies  Prescriptions prior to admission  Medication Sig Dispense Refill  . Prenatal Vit-Fe Fumarate-FA (PRENATAL MULTIVITAMIN) TABS Take 1 tablet by mouth daily.          Review of Systems  Constitutional: Negative.   Respiratory: Negative.   Cardiovascular: Negative.   Gastrointestinal: Positive for nausea and vomiting. Negative for abdominal pain, diarrhea and constipation.  Genitourinary: Negative for dysuria, urgency, frequency, hematuria and flank pain.       Negative for vaginal bleeding, vaginal discharge  Musculoskeletal: Negative.   Neurological: Negative.   Psychiatric/Behavioral: Negative.    Physical Exam   Blood pressure 110/58, pulse 71, temperature 98.7 F (37.1 C), temperature source Oral, resp. rate 18, height 5\' 1"  (1.549 m), weight 174 lb 8 oz (79.153 kg), last menstrual period 06/03/2011, SpO2 98.00%.  Physical Exam  Nursing note and vitals reviewed. Constitutional: She is oriented to person, place, and time. She appears well-developed and well-nourished. No distress.  Cardiovascular: Normal rate.   Respiratory: Effort normal.  GI: Soft. There is no tenderness.  Musculoskeletal: Normal range of motion.  Neurological: She is alert and oriented to person, place, and time.  Skin: Skin is warm and dry.  Psychiatric: She has a normal mood and  affect.    MAU Course  Procedures  Results for orders placed during the hospital encounter of 09/13/11 (from the past 24 hour(s))  URINALYSIS, ROUTINE W REFLEX MICROSCOPIC     Status: Normal   Collection Time   09/13/11  9:50 PM      Component Value Range   Color, Urine YELLOW  YELLOW    APPearance CLEAR  CLEAR    Specific Gravity, Urine 1.020  1.005 - 1.030    pH 6.0  5.0 - 8.0    Glucose, UA NEGATIVE  NEGATIVE (mg/dL)   Hgb urine dipstick NEGATIVE  NEGATIVE    Bilirubin Urine NEGATIVE  NEGATIVE    Ketones, ur NEGATIVE  NEGATIVE (mg/dL)   Protein, ur NEGATIVE  NEGATIVE (mg/dL)   Urobilinogen, UA 0.2  0.0 - 1.0 (mg/dL)   Nitrite NEGATIVE  NEGATIVE    Leukocytes, UA NEGATIVE  NEGATIVE     IV hydration, Zofran and Phenergan IV, tolerating PO clear liquids  Assessment and Plan  25 y.o. [id number] at [redacted]w[redacted]d N/V of pregnancy - Zofran and Phenergan rx, rev'd dietary measures F/U as scheduled  Fahad Cisse 09/13/2011, 10:32 PM

## 2011-09-13 NOTE — Progress Notes (Signed)
Vomited x 5 today

## 2011-09-14 MED ORDER — PROMETHAZINE HCL 25 MG/ML IJ SOLN
25.0000 mg | Freq: Four times a day (QID) | INTRAMUSCULAR | Status: DC | PRN
  Administered 2011-09-14: 25 mg via INTRAVENOUS
  Filled 2011-09-14: qty 1

## 2011-09-14 MED ORDER — PROMETHAZINE HCL 12.5 MG PO TABS: 25.0000 mg | ORAL_TABLET | Freq: Four times a day (QID) | ORAL | Status: AC | PRN

## 2011-09-14 MED ORDER — ONDANSETRON 4 MG PO TBDP: 4.0000 mg | ORAL_TABLET | Freq: Three times a day (TID) | ORAL | Status: AC | PRN

## 2011-10-09 ENCOUNTER — Inpatient Hospital Stay (HOSPITAL_COMMUNITY)
Admission: AD | Admit: 2011-10-09 | Discharge: 2011-10-09 | Disposition: A | Discharge status: Home / Self Care | Payer: Medicaid Other | Source: Ambulatory Visit | Attending: Obstetrics and Gynecology | Admitting: Obstetrics and Gynecology

## 2011-10-09 ENCOUNTER — Encounter (HOSPITAL_COMMUNITY): Payer: Self-pay | Admitting: *Deleted

## 2011-10-09 DIAGNOSIS — O99891 Other specified diseases and conditions complicating pregnancy: Secondary | ICD-10-CM | POA: Insufficient documentation

## 2011-10-09 DIAGNOSIS — R109 Unspecified abdominal pain: Secondary | ICD-10-CM | POA: Insufficient documentation

## 2011-10-09 DIAGNOSIS — N949 Unspecified condition associated with female genital organs and menstrual cycle: Secondary | ICD-10-CM

## 2011-10-09 LAB — URINALYSIS, ROUTINE W REFLEX MICROSCOPIC
Ketones, ur: NEGATIVE mg/dL
Protein, ur: NEGATIVE mg/dL
Urobilinogen, UA: 0.2 mg/dL (ref 0.0–1.0)

## 2011-10-09 LAB — URINE MICROSCOPIC-ADD ON

## 2011-10-09 MED ORDER — ACETAMINOPHEN 500 MG PO TABS
1000.0000 mg | ORAL_TABLET | Freq: Once | ORAL | Status: AC
  Administered 2011-10-09: 1000 mg via ORAL
  Filled 2011-10-09: qty 2

## 2011-10-09 NOTE — Progress Notes (Signed)
Patient states she has been having lower abdominal cramping/pressure off and on for a couple of days. No bleeding or discharge.

## 2011-10-09 NOTE — ED Provider Notes (Signed)
History     Chief Complaint  Patient presents with  . Abdominal Pain   HPI Judy Tate is 26 y.o. [id number] [redacted]w[redacted]d weeks presenting with right sided pain for the past few days. Patient of Dr. Berenda Morale, last seen in the office 12/28.  Describes as pressure and cramps.  Denies vaginal bleeding or discharge. Pregnancy complicated only by nausea and vomiting which is much improved.  Denies UTI sxs.  Last bowel movement yesterday, normal for her.     Past Medical History  Diagnosis Date  . No pertinent past medical history     Past Surgical History  Procedure Date  . No past surgeries     Family History  Problem Relation Age of Onset  . Diabetes Maternal Grandmother   . Anesthesia problems Neg Hx   . Cancer Mother     History  Substance Use Topics  . Smoking status: Never Smoker   . Smokeless tobacco: Never Used  . Alcohol Use: No    Allergies: No Known Allergies  Prescriptions prior to admission  Medication Sig Dispense Refill  . Prenatal Vit-Fe Fumarate-FA (PRENATAL MULTIVITAMIN) TABS Take 1 tablet by mouth daily.         Review of Systems  Constitutional: Negative.   Gastrointestinal: Positive for abdominal pain (cramping). Negative for nausea and vomiting.  Genitourinary: Negative.        Denies vaginal bleeding or discharge   Physical Exam   Blood pressure 114/54, pulse 89, temperature 98.5 F (36.9 C), temperature source Oral, resp. rate 16, height 5' 2.5" (1.588 m), weight 172 lb 9.6 oz (78.291 kg), last menstrual period 06/03/2011, SpO2 99.00%.  Physical Exam  Constitutional: She is oriented to person, place, and time. She appears well-developed and well-nourished. No distress.  HENT:  Head: Normocephalic.  Neck: Normal range of motion.  Cardiovascular: Normal rate.   Respiratory: Effort normal.  GI: Soft. She exhibits no distension and no mass. There is tenderness (bilateral mild tenderness). There is no rebound and no guarding.  Genitourinary:  Uterus is tender (mild). Cervix exhibits no discharge. No bleeding around the vagina. Vaginal discharge (Gilbert discharge without odor) found.  Neurological: She is alert and oriented to person, place, and time.  Skin: Skin is warm and dry.      Results for orders placed during the hospital encounter of 10/09/11 (from the past 24 hour(s))  URINALYSIS, ROUTINE W REFLEX MICROSCOPIC     Status: Abnormal   Collection Time   10/09/11  4:00 PM      Component Value Range   Color, Urine YELLOW  YELLOW    APPearance HAZY (*) CLEAR    Specific Gravity, Urine 1.025  1.005 - 1.030    pH 6.0  5.0 - 8.0    Glucose, UA NEGATIVE  NEGATIVE (mg/dL)   Hgb urine dipstick TRACE (*) NEGATIVE    Bilirubin Urine NEGATIVE  NEGATIVE    Ketones, ur NEGATIVE  NEGATIVE (mg/dL)   Protein, ur NEGATIVE  NEGATIVE (mg/dL)   Urobilinogen, UA 0.2  0.0 - 1.0 (mg/dL)   Nitrite NEGATIVE  NEGATIVE    Leukocytes, UA SMALL (*) NEGATIVE   URINE MICROSCOPIC-ADD ON     Status: Abnormal   Collection Time   10/09/11  4:00 PM      Component Value Range   Squamous Epithelial / LPF MANY (*) RARE    WBC, UA 0-2  <3 (WBC/hpf)   Bacteria, UA FEW (*) RARE     MAU Course  Procedures  MDM 17:10 reported to Dr. Senaida Ores patient's presenting sxs, pending UA.  If negative, may treat with tylenol and keep scheduled appointment. Tylenol 1gm po given in MAU.    Assessment and Plan  A:  Lower abdominal pain in second trimester    Round ligament pain  P: Stay well hydrated, take tylenol prn for pain and call for worsening sxs.  Keep scheduled appointment  [name],EVE M 10/09/2011, 4:38 PM   Matt Holmes, NP 10/09/11 1724

## 2011-11-07 ENCOUNTER — Emergency Department (HOSPITAL_COMMUNITY)
Admission: EM | Admit: 2011-11-07 | Discharge: 2011-11-07 | Disposition: A | Discharge status: Home / Self Care | Payer: Medicaid Other | Source: Home / Self Care

## 2011-11-07 ENCOUNTER — Encounter (HOSPITAL_COMMUNITY): Payer: Self-pay | Admitting: *Deleted

## 2011-11-07 DIAGNOSIS — M62838 Other muscle spasm: Secondary | ICD-10-CM

## 2011-11-07 HISTORY — DX: Pregnant state, incidental: Z33.1

## 2011-11-07 MED ORDER — CYCLOBENZAPRINE HCL 5 MG PO TABS: 5.0000 mg | ORAL_TABLET | Freq: Three times a day (TID) | ORAL | Status: AC | PRN

## 2011-11-07 NOTE — ED Notes (Signed)
Pt with c/o right shoulder pain increased pain with minimal movement of right arm - denies injury onset x 2 days

## 2011-11-07 NOTE — Discharge Instructions (Signed)
Tylenol as needed for discomfort.  Flexeril can cause drowsiness. Do not drive or operate machinery. Warm compresses or heating pad as needed for discomfort. You symptoms should begin to improve in the next couple of days. If your discomfort persists on Monday follow up with your primary care dr.

## 2011-11-07 NOTE — ED Provider Notes (Signed)
History     CSN: [id number]  Arrival date & time 11/07/11  1020   None     Chief Complaint  Patient presents with  . Shoulder Pain    (Consider location/radiation/quality/duration/timing/severity/associated sxs/prior treatment) HPI Comments: Judy Tate presents with c/o Rt shoulder area discomfort x 2 days. She is [redacted] weeks pregnant. Pain is present in the posterior shoulder area and mildly anterior chest too. Pain worsens with movement of the shoulder and neck. She denies injury or aggravating activity. She has not tried anything for her symptoms.    Past Medical History  Diagnosis Date  . No pertinent past medical history   . Pregnant state, incidental     Past Surgical History  Procedure Date  . No past surgeries     Family History  Problem Relation Age of Onset  . Diabetes Maternal Grandmother   . Anesthesia problems Neg Hx   . Cancer Mother     History  Substance Use Topics  . Smoking status: Never Smoker   . Smokeless tobacco: Never Used  . Alcohol Use: No    OB History    Grav Para Term Preterm Abortions TAB SAB Ect Mult Living   4 2 2  0 1 0 1 0 0 2      Review of Systems  Constitutional: Negative for fever and chills.  Respiratory: Negative for cough and shortness of breath.   Musculoskeletal: Negative for back pain and joint swelling.  Skin: Negative for color change, rash and wound.  Neurological: Negative for numbness.    Allergies  Review of patient's allergies indicates no known allergies.  Home Medications   Current Outpatient Rx  Name Route Sig Dispense Refill  . PRENATAL MULTIVITAMIN CH Oral Take 1 tablet by mouth daily.     . CYCLOBENZAPRINE HCL 5 MG PO TABS Oral Take 1 tablet (5 mg total) by mouth 3 (three) times daily as needed for muscle spasms. 15 tablet 0    BP 116/76  Pulse 68  Temp(Src) 98.4 F (36.9 C) (Oral)  Resp 16  SpO2 98%  LMP 06/26/2011  Physical Exam  Nursing note and vitals reviewed. Constitutional: She  appears well-developed and well-nourished. No distress.  HENT:  Head: Normocephalic and atraumatic.  Cardiovascular: Normal rate, regular rhythm and normal heart sounds.   Pulses:      Radial pulses are 2+ on the right side.  Pulmonary/Chest: Effort normal and breath sounds normal. No respiratory distress. She exhibits no tenderness.  Musculoskeletal:       Right shoulder: She exhibits tenderness and spasm. She exhibits normal range of motion, no bony tenderness, no swelling, no deformity, no laceration, normal pulse and normal strength.       Arms: Neurological: She is alert.  Skin: Skin is warm and dry. No rash noted. No erythema.  Psychiatric: She has a normal mood and affect.    ED Course  Procedures (including critical care time)  Labs Reviewed - No data to display No results found.   1. Trapezius muscle spasm       MDM          Melody Comas, PA 11/07/11 1200

## 2011-11-09 NOTE — ED Provider Notes (Signed)
Medical screening examination/treatment/procedure(s) were performed by non-physician practitioner and as supervising physician I was immediately available for consultation/collaboration.  Joushua Dugar M. MD   Shantele Reller M Karleen Seebeck, MD 11/09/11 2120 

## 2012-01-08 ENCOUNTER — Inpatient Hospital Stay (HOSPITAL_COMMUNITY)
Admission: AD | Admit: 2012-01-08 | Discharge: 2012-01-08 | Disposition: A | Discharge status: Home / Self Care | Payer: Medicaid Other | Source: Ambulatory Visit | Attending: Obstetrics and Gynecology | Admitting: Obstetrics and Gynecology

## 2012-01-08 ENCOUNTER — Encounter (HOSPITAL_COMMUNITY): Payer: Self-pay | Admitting: *Deleted

## 2012-01-08 DIAGNOSIS — O4700 False labor before 37 completed weeks of gestation, unspecified trimester: Secondary | ICD-10-CM | POA: Insufficient documentation

## 2012-01-08 DIAGNOSIS — R109 Unspecified abdominal pain: Secondary | ICD-10-CM | POA: Insufficient documentation

## 2012-01-08 DIAGNOSIS — N949 Unspecified condition associated with female genital organs and menstrual cycle: Secondary | ICD-10-CM

## 2012-01-08 LAB — WET PREP, GENITAL
Clue Cells Wet Prep HPF POC: NONE SEEN
Yeast Wet Prep HPF POC: NONE SEEN

## 2012-01-08 LAB — FETAL FIBRONECTIN: Fetal Fibronectin: NEGATIVE

## 2012-01-08 LAB — URINALYSIS, ROUTINE W REFLEX MICROSCOPIC
Glucose, UA: NEGATIVE mg/dL
Hgb urine dipstick: NEGATIVE
Leukocytes, UA: NEGATIVE
Protein, ur: NEGATIVE mg/dL
pH: 6 (ref 5.0–8.0)

## 2012-01-08 NOTE — MAU Note (Signed)
Pt G4 P2 at 27.5wks reports cramping for a few weeks, contractions x 1 wk.  Tonight contractions became stronger every 15 min.  Denies bleeding or leaking.  No problems with pregnancy.

## 2012-01-08 NOTE — MAU Provider Note (Signed)
History     CSN: [id number]  Arrival date and time: 01/08/12 4098   None     Chief Complaint  Patient presents with  . Abdominal Cramping   HPI 26 y.o. [id number] at [redacted]w[redacted]d with cramping x a few weeks, no bleeding or discharge, episode of contractions from 12 AM to  3 AM, q 15 minutes, now resolved. Uncomplicated prenatal course.    Past Medical History  Diagnosis Date  . No pertinent past medical history   . Pregnant state, incidental     Past Surgical History  Procedure Date  . No past surgeries     Family History  Problem Relation Age of Onset  . Diabetes Maternal Grandmother   . Anesthesia problems Neg Hx   . Cancer Mother     History  Substance Use Topics  . Smoking status: Never Smoker   . Smokeless tobacco: Never Used  . Alcohol Use: No    Allergies: No Known Allergies  Prescriptions prior to admission  Medication Sig Dispense Refill  . Prenatal Vit-Fe Fumarate-FA (PRENATAL MULTIVITAMIN) TABS Take 1 tablet by mouth daily.         Review of Systems  Constitutional: Negative.   Respiratory: Negative.   Cardiovascular: Negative.   Gastrointestinal: Negative for nausea, vomiting, abdominal pain, diarrhea and constipation.  Genitourinary: Negative for dysuria, urgency, frequency, hematuria and flank pain.       Negative for vaginal bleeding, Positive cramping/contractions  Musculoskeletal: Negative.   Neurological: Negative.   Psychiatric/Behavioral: Negative.    Physical Exam   Blood pressure 110/57, pulse 78, temperature 97 F (36.1 C), temperature source Oral, resp. rate 18, height 5\' 2"  (1.575 m), weight 180 lb 6.4 oz (81.829 kg), last menstrual period 06/26/2011.  Physical Exam  Nursing note and vitals reviewed. Constitutional: She is oriented to person, place, and time. She appears well-developed and well-nourished. No distress.  HENT:  Head: Normocephalic and atraumatic.  Cardiovascular: Normal rate.   Respiratory: Effort normal.  GI:  Soft. Bowel sounds are normal. She exhibits no mass. There is no tenderness. There is no rebound and no guarding.  Genitourinary: There is no rash or lesion on the right labia. There is no rash or lesion on the left labia. Uterus is not tender. Enlarged: Size c/w dates. Cervix exhibits no motion tenderness, no discharge and no friability. No tenderness or bleeding around the vagina. Vaginal discharge (Lac) found.       GNF:AOZHYQ/MVHQI/ONGE  Musculoskeletal: Normal range of motion.  Neurological: She is alert and oriented to person, place, and time.  Skin: Skin is warm and dry.  Psychiatric: She has a normal mood and affect.   EFM reactive, TOCO: UC irregular  MAU Course  Procedures  Results for orders placed during the hospital encounter of 01/08/12 (from the past 24 hour(s))  URINALYSIS, ROUTINE W REFLEX MICROSCOPIC     Status: Normal   Collection Time   01/08/12  6:15 AM      Component Value Range   Color, Urine YELLOW  YELLOW    APPearance CLEAR  CLEAR    Specific Gravity, Urine 1.025  1.005 - 1.030    pH 6.0  5.0 - 8.0    Glucose, UA NEGATIVE  NEGATIVE (mg/dL)   Hgb urine dipstick NEGATIVE  NEGATIVE    Bilirubin Urine NEGATIVE  NEGATIVE    Ketones, ur NEGATIVE  NEGATIVE (mg/dL)   Protein, ur NEGATIVE  NEGATIVE (mg/dL)   Urobilinogen, UA 0.2  0.0 - 1.0 (mg/dL)  Nitrite NEGATIVE  NEGATIVE    Leukocytes, UA NEGATIVE  NEGATIVE   WET PREP, GENITAL     Status: Abnormal   Collection Time   01/08/12  7:23 AM      Component Value Range   Yeast Wet Prep HPF POC NONE SEEN  NONE SEEN    Trich, Wet Prep NONE SEEN  NONE SEEN    Clue Cells Wet Prep HPF POC NONE SEEN  NONE SEEN    WBC, Wet Prep HPF POC FEW (*) NONE SEEN      Assessment and Plan  FFN pending, Dr. Senaida Ores aware Care assumed by Henrietta Hoover, PA  [facility] 01/08/2012, 7:55 AM   I have assumed care of this pt from Georges Mouse, CNM.  Results for orders placed during the hospital encounter of 01/08/12  (from the past 24 hour(s))  URINALYSIS, ROUTINE W REFLEX MICROSCOPIC     Status: Normal   Collection Time   01/08/12  6:15 AM      Component Value Range   Color, Urine YELLOW  YELLOW    APPearance CLEAR  CLEAR    Specific Gravity, Urine 1.025  1.005 - 1.030    pH 6.0  5.0 - 8.0    Glucose, UA NEGATIVE  NEGATIVE (mg/dL)   Hgb urine dipstick NEGATIVE  NEGATIVE    Bilirubin Urine NEGATIVE  NEGATIVE    Ketones, ur NEGATIVE  NEGATIVE (mg/dL)   Protein, ur NEGATIVE  NEGATIVE (mg/dL)   Urobilinogen, UA 0.2  0.0 - 1.0 (mg/dL)   Nitrite NEGATIVE  NEGATIVE    Leukocytes, UA NEGATIVE  NEGATIVE   WET PREP, GENITAL     Status: Abnormal   Collection Time   01/08/12  7:23 AM      Component Value Range   Yeast Wet Prep HPF POC NONE SEEN  NONE SEEN    Trich, Wet Prep NONE SEEN  NONE SEEN    Clue Cells Wet Prep HPF POC NONE SEEN  NONE SEEN    WBC, Wet Prep HPF POC FEW (*) NONE SEEN   FETAL FIBRONECTIN     Status: Normal   Collection Time   01/08/12  7:23 AM      Component Value Range   Fetal Fibronectin NEGATIVE  NEGATIVE    A/P: Round ligament pain: discussed with pt at length. She has f/u scheduled with Dr. Berenda Morale office. Discussed diet, activity, risks, and precautions.  Clinton Gallant. Hayle Parisi III, DrHSc, MPAS, PA-C

## 2012-02-17 ENCOUNTER — Encounter (HOSPITAL_COMMUNITY): Payer: Self-pay

## 2012-02-17 ENCOUNTER — Inpatient Hospital Stay (HOSPITAL_COMMUNITY)
Admission: AD | Admit: 2012-02-17 | Discharge: 2012-02-17 | Disposition: A | Discharge status: Home / Self Care | Payer: BC Managed Care – PPO | Source: Ambulatory Visit | Attending: Obstetrics and Gynecology | Admitting: Obstetrics and Gynecology

## 2012-02-17 DIAGNOSIS — O99891 Other specified diseases and conditions complicating pregnancy: Secondary | ICD-10-CM | POA: Insufficient documentation

## 2012-02-17 DIAGNOSIS — O26899 Other specified pregnancy related conditions, unspecified trimester: Secondary | ICD-10-CM

## 2012-02-17 DIAGNOSIS — R109 Unspecified abdominal pain: Secondary | ICD-10-CM | POA: Insufficient documentation

## 2012-02-17 LAB — URINALYSIS, ROUTINE W REFLEX MICROSCOPIC
Bilirubin Urine: NEGATIVE
Glucose, UA: NEGATIVE mg/dL
Hgb urine dipstick: NEGATIVE
Ketones, ur: 40 mg/dL — AB
Leukocytes, UA: NEGATIVE
Protein, ur: NEGATIVE mg/dL
pH: 7 (ref 5.0–8.0)

## 2012-02-17 NOTE — Discharge Instructions (Signed)
Drink at least 8 8-oz glasses of water every day. Take Tylenol 325 mg 2 tablets by mouth every 4 hours if needed for pain. Return if your symptoms worsen, but today your cervix is closed and no infection is found.

## 2012-02-17 NOTE — MAU Provider Note (Signed)
History     CSN: [id number]  Arrival date and time: 02/17/12 4098   First Provider Initiated Contact with Patient 02/17/12 1040      Chief Complaint  Patient presents with  . Abdominal Cramping   HPI Judy Tate 26 y.o. [redacted]w[redacted]d  OB History    Grav Para Term Preterm Abortions TAB SAB Ect Mult Living   4 2 2  0 1 0 1 0 0 2      Past Medical History  Diagnosis Date  . No pertinent past medical history   . Pregnant state, incidental     Past Surgical History  Procedure Date  . No past surgeries     Family History  Problem Relation Age of Onset  . Diabetes Maternal Grandmother   . Anesthesia problems Neg Hx   . Cancer Mother     History  Substance Use Topics  . Smoking status: Never Smoker   . Smokeless tobacco: Never Used  . Alcohol Use: No    Allergies: No Known Allergies  Prescriptions prior to admission  Medication Sig Dispense Refill  . Prenatal Vit-Fe Fumarate-FA (PRENATAL MULTIVITAMIN) TABS Take 1 tablet by mouth daily.         Review of Systems  Gastrointestinal: Positive for abdominal pain. Negative for nausea, vomiting and diarrhea.  Genitourinary: Negative for dysuria.   Physical Exam   Blood pressure 113/58, pulse 89, resp. rate 16, last menstrual period 06/26/2011.  Physical Exam  Nursing note and vitals reviewed. Constitutional: She is oriented to person, place, and time. She appears well-developed and well-nourished.  HENT:  Head: Normocephalic.  Eyes: EOM are normal.  Neck: Neck supple.  GI: Soft. There is no tenderness.       Uterine irritability seen on monitor strip - resolved with PO fluids.  FHT baseline 125 - reactive with 15x15 accels.  Genitourinary:       Speculum exam: Vagina - Small amount of creamy discharge, no odor Cervix - No contact bleeding Bimanual exam: Cervix int os closed, thick Uterus gravid  Adnexa non tender, no masses bilaterally Chaperone present for exam.  Musculoskeletal: Normal range of motion.    Neurological: She is alert and oriented to person, place, and time.  Skin: Skin is warm and dry.  Psychiatric: She has a normal mood and affect.    MAU Course  Procedures Results for orders placed during the hospital encounter of 02/17/12 (from the past 24 hour(s))  URINALYSIS, ROUTINE W REFLEX MICROSCOPIC     Status: Abnormal   Collection Time   02/17/12 10:05 AM      Component Value Range   Color, Urine YELLOW  YELLOW    APPearance HAZY (*) CLEAR    Specific Gravity, Urine 1.015  1.005 - 1.030    pH 7.0  5.0 - 8.0    Glucose, UA NEGATIVE  NEGATIVE (mg/dL)   Hgb urine dipstick NEGATIVE  NEGATIVE    Bilirubin Urine NEGATIVE  NEGATIVE    Ketones, ur 40 (*) NEGATIVE (mg/dL)   Protein, ur NEGATIVE  NEGATIVE (mg/dL)   Urobilinogen, UA 0.2  0.0 - 1.0 (mg/dL)   Nitrite NEGATIVE  NEGATIVE    Leukocytes, UA NEGATIVE  NEGATIVE     MDM Consult with Dr. Jackelyn Knife re: plan of care  Assessment and Plan  Abdominal pain in pregnancy  Plan Drink at least 8 8-oz glasses of water every day. Take Tylenol 325 mg 2 tablets by mouth every 4 hours if needed for pain. Return if  your symptoms worsen, but today your cervix is closed and no infection is found.  Judy Tate 02/17/2012, 10:50 AM

## 2012-02-17 NOTE — MAU Note (Signed)
Patient given apple juice

## 2012-02-17 NOTE — MAU Note (Signed)
Patient presents with c/o of cramping and pressure since 9:00 p.m. Last night has continued urinary frequency, no vaginal bleeding or discharge.

## 2012-02-20 NOTE — Progress Notes (Signed)
FHT from 5-26 reviewed.  Reactive NST, no ctx.

## 2012-03-11 ENCOUNTER — Inpatient Hospital Stay (HOSPITAL_COMMUNITY)
Admission: AD | Admit: 2012-03-11 | Discharge: 2012-03-11 | Disposition: A | Discharge status: Home / Self Care | Payer: BC Managed Care – PPO | Source: Ambulatory Visit | Attending: Obstetrics and Gynecology | Admitting: Obstetrics and Gynecology

## 2012-03-11 ENCOUNTER — Encounter (HOSPITAL_COMMUNITY): Payer: Self-pay | Admitting: *Deleted

## 2012-03-11 DIAGNOSIS — O479 False labor, unspecified: Secondary | ICD-10-CM | POA: Insufficient documentation

## 2012-03-11 NOTE — Discharge Instructions (Signed)

## 2012-03-11 NOTE — MAU Note (Signed)
Pt reports contractions q 7-10 minutes. Denies bleeding or gush of fluid. Reports good fetal movement.

## 2012-03-11 NOTE — MAU Note (Signed)
Dr Ellyn Hack notified of patients arrival, cervix 1.-1.5/50/vt/-2, rective tracing and intact bow. Plan: discharge patient to home with labor precautions

## 2012-03-24 ENCOUNTER — Encounter (HOSPITAL_COMMUNITY): Payer: Self-pay | Admitting: *Deleted

## 2012-03-24 ENCOUNTER — Telehealth (HOSPITAL_COMMUNITY): Payer: Self-pay | Admitting: *Deleted

## 2012-03-24 NOTE — Telephone Encounter (Signed)
Preadmission screen  

## 2012-03-26 ENCOUNTER — Encounter (HOSPITAL_COMMUNITY): Payer: Self-pay | Admitting: *Deleted

## 2012-03-26 ENCOUNTER — Inpatient Hospital Stay (HOSPITAL_COMMUNITY)
Admission: AD | Admit: 2012-03-26 | Discharge: 2012-03-27 | Disposition: A | Discharge status: Home / Self Care | Payer: BC Managed Care – PPO | Source: Ambulatory Visit | Attending: Obstetrics and Gynecology | Admitting: Obstetrics and Gynecology

## 2012-03-26 DIAGNOSIS — O479 False labor, unspecified: Secondary | ICD-10-CM | POA: Insufficient documentation

## 2012-03-26 NOTE — MAU Note (Signed)
Having contractions about 5-8 minutes apart

## 2012-03-27 NOTE — H&P (Signed)
Judy Tate is a 26 y.o. female [id number] at 45 1/7 weeks  (EDD 04/03/12 by LMP c/w 8 week [place]) presenting for induction of labor given term status and multiparous.  Prenatal care significant for +GBS status, but no other issues.    History OB History    Grav Para Term Preterm Abortions TAB SAB Ect Mult Living   4 2 2  0 1 0 1 0 0 2    2007 NSVD 5#2oz 2009 NSVD 7#2oz 2012 SAB  Past Medical History  Diagnosis Date  . Pregnant state, incidental   . H/O chlamydia infection    Past Surgical History  Procedure Date  . No past surgeries    Family History: family history includes Cancer in her maternal uncle, mother, and paternal grandfather; Diabetes in her cousin and maternal grandmother; and Hypertension in her father.  There is no history of Anesthesia problems. Social History:  reports that she has never smoked. She has never used smokeless tobacco. She reports that she does not drink alcohol or use illicit drugs.   Prenatal Transfer Tool  Maternal Diabetes: No Genetic Screening: Normal Maternal Ultrasounds/Referrals: Normal Fetal Ultrasounds or other Referrals:  None Maternal Substance Abuse:  No Significant Maternal Medications:  None Significant Maternal Lab Results:  Lab values include: Group B Strep positive Other Comments:  None  ROS    Last menstrual period 06/26/2011. Maternal Exam:  Uterine Assessment: Contraction strength is mild.  Contraction frequency is irregular.   Abdomen: Patient reports no abdominal tenderness. Fetal presentation: vertex  Introitus: Normal vulva. Normal vagina.    Physical Exam  Constitutional: She is oriented to person, place, and time. She appears well-developed and well-nourished.  Cardiovascular: Normal rate and regular rhythm.   Respiratory: Effort normal and breath sounds normal.  GI: Soft.  Genitourinary: Vagina normal and uterus normal.  Neurological: She is alert and oriented to person, place, and time.  Psychiatric: She has  a normal mood and affect. Her behavior is normal.    Prenatal labs: ABO, Rh: A/Positive/-- (12/10 0000) Antibody: Negative (12/10 0000) Rubella: Immune (12/10 0000) RPR: Nonreactive (12/10 0000)  HBsAg: Negative (12/10 0000)  HIV: Non-reactive (12/10 0000)  GBS: Positive (06/19 0000)  One hour GCT 66 First trimester screen and AFP WNL   Assessment/Plan: Admission with IOL at term with Dr. Ambrose Mantle planned.  Will begin PCN prophylaxis on admission and pitocin.  [name] 03/27/2012, 7:02 PM

## 2012-03-28 ENCOUNTER — Inpatient Hospital Stay (HOSPITAL_COMMUNITY)
Admission: RE | Admit: 2012-03-28 | Discharge: 2012-03-30 | DRG: 775 | Disposition: A | Discharge status: Home / Self Care | Payer: Medicaid Other | Source: Ambulatory Visit | Attending: Obstetrics and Gynecology | Admitting: Obstetrics and Gynecology

## 2012-03-28 ENCOUNTER — Encounter (HOSPITAL_COMMUNITY): Payer: Self-pay | Admitting: Anesthesiology

## 2012-03-28 ENCOUNTER — Encounter (HOSPITAL_COMMUNITY): Payer: Self-pay

## 2012-03-28 ENCOUNTER — Inpatient Hospital Stay (HOSPITAL_COMMUNITY): Payer: Medicaid Other | Admitting: Anesthesiology

## 2012-03-28 DIAGNOSIS — O99892 Other specified diseases and conditions complicating childbirth: Secondary | ICD-10-CM | POA: Diagnosis present

## 2012-03-28 DIAGNOSIS — Z22330 Carrier of Group B streptococcus: Secondary | ICD-10-CM

## 2012-03-28 LAB — CBC
MCH: 25.3 pg — ABNORMAL LOW (ref 26.0–34.0)
MCHC: 32.3 g/dL (ref 30.0–36.0)
Platelets: 226 10*3/uL (ref 150–400)
RBC: 4.46 MIL/uL (ref 3.87–5.11)
RDW: 14.7 % (ref 11.5–15.5)

## 2012-03-28 LAB — RPR: RPR Ser Ql: NONREACTIVE

## 2012-03-28 MED ORDER — FLEET ENEMA 7-19 GM/118ML RE ENEM
1.0000 | ENEMA | RECTAL | Status: DC | PRN
  Filled 2012-03-28: qty 1

## 2012-03-28 MED ORDER — OXYTOCIN BOLUS FROM INFUSION
250.0000 mL | Freq: Once | INTRAVENOUS | Status: DC
  Filled 2012-03-28: qty 500

## 2012-03-28 MED ORDER — IBUPROFEN 600 MG PO TABS
600.0000 mg | ORAL_TABLET | Freq: Four times a day (QID) | ORAL | Status: DC | PRN
  Filled 2012-03-28: qty 1

## 2012-03-28 MED ORDER — EPHEDRINE 5 MG/ML INJ: 10.0000 mg | INTRAVENOUS | Status: DC | PRN

## 2012-03-28 MED ORDER — PENICILLIN G POTASSIUM 5000000 UNITS IJ SOLR
5.0000 10*6.[IU] | Freq: Once | INTRAVENOUS | Status: AC
  Administered 2012-03-28: 5 10*6.[IU] via INTRAVENOUS
  Filled 2012-03-28: qty 5

## 2012-03-28 MED ORDER — OXYTOCIN 40 UNITS IN LACTATED RINGERS INFUSION - SIMPLE MED
1.0000 m[IU]/min | INTRAVENOUS | Status: DC
  Administered 2012-03-28: 1 m[IU]/min via INTRAVENOUS
  Filled 2012-03-28 (×2): qty 1000

## 2012-03-28 MED ORDER — LACTATED RINGERS IV SOLN
500.0000 mL | INTRAVENOUS | Status: DC | PRN
  Filled 2012-03-28: qty 1000

## 2012-03-28 MED ORDER — LIDOCAINE HCL (PF) 1 % IJ SOLN
30.0000 mL | INTRAMUSCULAR | Status: DC | PRN
  Filled 2012-03-28: qty 30

## 2012-03-28 MED ORDER — BUTORPHANOL TARTRATE 1 MG/ML IJ SOLN
1.0000 mg | INTRAMUSCULAR | Status: DC | PRN
  Administered 2012-03-28: 1 mg via INTRAVENOUS
  Filled 2012-03-28 (×2): qty 1

## 2012-03-28 MED ORDER — LACTATED RINGERS IV SOLN
INTRAVENOUS | Status: DC
  Administered 2012-03-28 (×3): via INTRAVENOUS
  Filled 2012-03-28: qty 1000

## 2012-03-28 MED ORDER — TERBUTALINE SULFATE 1 MG/ML IJ SOLN
0.2500 mg | Freq: Once | INTRAMUSCULAR | Status: AC | PRN
  Filled 2012-03-28: qty 1

## 2012-03-28 MED ORDER — PHENYLEPHRINE 40 MCG/ML (10ML) SYRINGE FOR IV PUSH (FOR BLOOD PRESSURE SUPPORT): 80.0000 ug | PREFILLED_SYRINGE | INTRAVENOUS | Status: DC | PRN

## 2012-03-28 MED ORDER — ONDANSETRON HCL 4 MG/2ML IJ SOLN
4.0000 mg | Freq: Four times a day (QID) | INTRAMUSCULAR | Status: DC | PRN
  Administered 2012-03-28: 4 mg via INTRAVENOUS
  Filled 2012-03-28 (×2): qty 2

## 2012-03-28 MED ORDER — ACETAMINOPHEN 325 MG PO TABS
650.0000 mg | ORAL_TABLET | ORAL | Status: DC | PRN
  Filled 2012-03-28: qty 2

## 2012-03-28 MED ORDER — CITRIC ACID-SODIUM CITRATE 334-500 MG/5ML PO SOLN
30.0000 mL | ORAL | Status: DC | PRN
  Filled 2012-03-28: qty 30

## 2012-03-28 MED ORDER — LACTATED RINGERS IV SOLN: 500.0000 mL | Freq: Once | INTRAVENOUS | Status: DC

## 2012-03-28 MED ORDER — PHENYLEPHRINE 40 MCG/ML (10ML) SYRINGE FOR IV PUSH (FOR BLOOD PRESSURE SUPPORT)
80.0000 ug | PREFILLED_SYRINGE | INTRAVENOUS | Status: DC | PRN
Start: 2012-03-28 — End: 2012-03-29
  Administered 2012-03-28: 80 ug via INTRAVENOUS
  Filled 2012-03-28: qty 5

## 2012-03-28 MED ORDER — FENTANYL 2.5 MCG/ML BUPIVACAINE 1/10 % EPIDURAL INFUSION (WH - ANES)
INTRAMUSCULAR | Status: DC | PRN
  Administered 2012-03-28: 14 mL/h via EPIDURAL

## 2012-03-28 MED ORDER — OXYTOCIN 40 UNITS IN LACTATED RINGERS INFUSION - SIMPLE MED
62.5000 mL/h | Freq: Once | INTRAVENOUS | Status: DC
  Filled 2012-03-28: qty 1000

## 2012-03-28 MED ORDER — EPHEDRINE 5 MG/ML INJ
10.0000 mg | INTRAVENOUS | Status: DC | PRN
  Filled 2012-03-28: qty 4

## 2012-03-28 MED ORDER — DIPHENHYDRAMINE HCL 50 MG/ML IJ SOLN: 12.5000 mg | INTRAMUSCULAR | Status: DC | PRN

## 2012-03-28 MED ORDER — OXYCODONE-ACETAMINOPHEN 5-325 MG PO TABS
1.0000 | ORAL_TABLET | ORAL | Status: DC | PRN
  Filled 2012-03-28: qty 2

## 2012-03-28 MED ORDER — PENICILLIN G POTASSIUM 5000000 UNITS IJ SOLR
2.5000 10*6.[IU] | INTRAVENOUS | Status: DC
  Administered 2012-03-28 (×3): 2.5 10*6.[IU] via INTRAVENOUS
  Filled 2012-03-28 (×4): qty 2.5

## 2012-03-28 MED ORDER — FENTANYL 2.5 MCG/ML BUPIVACAINE 1/10 % EPIDURAL INFUSION (WH - ANES)
14.0000 mL/h | INTRAMUSCULAR | Status: DC
  Administered 2012-03-28 (×2): 14 mL/h via EPIDURAL
  Filled 2012-03-28 (×3): qty 60

## 2012-03-28 NOTE — Anesthesia Preprocedure Evaluation (Signed)

## 2012-03-28 NOTE — Progress Notes (Signed)
Patient ID: Judy Tate, female   DOB: [birth year], 25 y.o.   MRN: [id number] Pitocin at 23 mu/ minujte and the cervix is 9 cm 90% effaced and the vertex is at 0 station. Will increase the pitocin.

## 2012-03-28 NOTE — Anesthesia Procedure Notes (Signed)
Epidural Patient location during procedure: OB Start time: 03/28/2012 2:33 PM End time: 03/28/2012 2:37 PM Reason for block: procedure for pain  Staffing Anesthesiologist: Sandrea Hughs Performed by: anesthesiologist   Preanesthetic Checklist Completed: patient identified, site marked, surgical consent, pre-op evaluation, timeout performed, IV checked, risks and benefits discussed and monitors and equipment checked  Epidural Patient position: sitting Prep: site prepped and draped and DuraPrep Patient monitoring: continuous pulse ox and blood pressure Approach: midline Injection technique: LOR air  Needle:  Needle type: Tuohy  Needle gauge: 17 G Needle length: 9 cm Needle insertion depth: 5 cm cm Catheter type: closed end flexible Catheter size: 19 Gauge Catheter at skin depth: 10 cm Test dose: negative and Other  Assessment Sensory level: T8 Events: blood not aspirated, injection not painful, no injection resistance, negative IV test and no paresthesia

## 2012-03-28 NOTE — Progress Notes (Signed)
Patient ID: Judy Tate, female   DOB: 1986-01-28, 26 y.o.   MRN: [id number] Prior to my last note the pt's epidural got to a high level and she felt she was having trouble breathing. The epidural was cut off. Now, the contractions are q 2-4 minutes and the cervix is 8-9 cm 80-90% effaced and the vertex is at 0 station. We will raise the pitocin.

## 2012-03-28 NOTE — Progress Notes (Signed)
Patient ID: Judy Tate, female   DOB: [birth year], 26 y.o.   MRN: [id number] The RN noted some decelerations at 7:36 PM and changed her position and gave her a fluid bolus. The cervix was 8 cm 80% effaced and the vertex was at -1 station. By 7:48 PM the FHR was back to normal. The cervix is unchanged but the vertex is at 0 station.Will continue to observe closely.

## 2012-03-28 NOTE — Progress Notes (Signed)
Patient ID: Judy Tate, female   DOB: 01/13/1986, 26 y.o.   MRN: [id number] Pitocin is at 19 mu/ minute and the contractions are q 2-3 minutes. The cervix is 5 cm 80% effaced and the vertex is at -2 station.FHR tracing is normal. The pt has her epidural and is comfortable.

## 2012-03-28 NOTE — Progress Notes (Signed)
Patient ID: Judy Tate, female   DOB: [birth year], 26 y.o.   MRN: [id number] Delivery note:  The pt became fully dilated and pushed well. She delivered spontaneously LOA over an intact perineum a living female infant weight pending, Apgars 8 and 9 at 1 and 5 minutes. There was one loop of nuchal cord that became tighter during the delivery. There was some shoulder dystocia that was managed by McRoberts, woodscrew and delivering the posterior arm. Total time was less than 30 seconds. The placenta was delivered intact and the uterus was normal. There were no lacerations except a skid mark on the perineum. No suture was required. EBL 300 cc's.

## 2012-03-28 NOTE — Progress Notes (Signed)
Pt C/O chest heaviness and SOB. Pulse ox applied Hat[place]tt in to see pt. Epidural infusion suspended. Will restart at 1900

## 2012-03-28 NOTE — Progress Notes (Signed)
Patient ID: Judy Tate, female   DOB: 1986/05/15, 26 y.o.   MRN: [id number] Pt is admitted for induction. The cervix is FT 50 % effaced and the vertex is at -3 station. Will begin pitocin and penicillin.

## 2012-03-28 NOTE — Progress Notes (Signed)
Patient ID: Judy Tate, female   DOB: 01/11/1986, 26 y.o.   MRN: [id number] Pitocin is at 15 mu/ minute and the contractions are q 2 and 1/2 minutes. The cervix is 2+ cm and 70% effaced and the vertex is at -2 station AROM produced clear fluid.

## 2012-03-29 ENCOUNTER — Encounter (HOSPITAL_COMMUNITY): Payer: Self-pay

## 2012-03-29 LAB — CBC
Hemoglobin: 10.7 g/dL — ABNORMAL LOW (ref 12.0–15.0)
MCH: 25.1 pg — ABNORMAL LOW (ref 26.0–34.0)
MCV: 78.7 fL (ref 78.0–100.0)
Platelets: 230 10*3/uL (ref 150–400)
RBC: 4.27 MIL/uL (ref 3.87–5.11)

## 2012-03-29 MED ORDER — ONDANSETRON HCL 4 MG/2ML IJ SOLN: 4.0000 mg | INTRAMUSCULAR | Status: DC | PRN

## 2012-03-29 MED ORDER — OXYCODONE-ACETAMINOPHEN 5-325 MG PO TABS
1.0000 | ORAL_TABLET | ORAL | Status: DC | PRN
  Administered 2012-03-29 (×2): 1 via ORAL
  Filled 2012-03-29 (×2): qty 1

## 2012-03-29 MED ORDER — ONDANSETRON HCL 4 MG PO TABS: 4.0000 mg | ORAL_TABLET | ORAL | Status: DC | PRN

## 2012-03-29 MED ORDER — DIPHENHYDRAMINE HCL 25 MG PO CAPS: 25.0000 mg | ORAL_CAPSULE | Freq: Four times a day (QID) | ORAL | Status: DC | PRN

## 2012-03-29 MED ORDER — MEASLES, MUMPS & RUBELLA VAC ~~LOC~~ INJ
0.5000 mL | INJECTION | Freq: Once | SUBCUTANEOUS | Status: DC
  Filled 2012-03-29: qty 0.5

## 2012-03-29 MED ORDER — LANOLIN HYDROUS EX OINT: TOPICAL_OINTMENT | CUTANEOUS | Status: DC | PRN

## 2012-03-29 MED ORDER — CALCIUM CARBONATE ANTACID 500 MG PO CHEW: 1.0000 | CHEWABLE_TABLET | Freq: Three times a day (TID) | ORAL | Status: DC | PRN

## 2012-03-29 MED ORDER — WITCH HAZEL-GLYCERIN EX PADS: 1.0000 "application " | MEDICATED_PAD | CUTANEOUS | Status: DC | PRN

## 2012-03-29 MED ORDER — IBUPROFEN 600 MG PO TABS
600.0000 mg | ORAL_TABLET | Freq: Four times a day (QID) | ORAL | Status: DC
  Administered 2012-03-29 – 2012-03-30 (×6): 600 mg via ORAL
  Filled 2012-03-29 (×6): qty 1

## 2012-03-29 MED ORDER — PRENATAL MULTIVITAMIN CH: 1.0000 | ORAL_TABLET | Freq: Every morning | ORAL | Status: DC

## 2012-03-29 MED ORDER — SIMETHICONE 80 MG PO CHEW: 80.0000 mg | CHEWABLE_TABLET | ORAL | Status: DC | PRN

## 2012-03-29 MED ORDER — ZOLPIDEM TARTRATE 5 MG PO TABS: 5.0000 mg | ORAL_TABLET | Freq: Every evening | ORAL | Status: DC | PRN

## 2012-03-29 MED ORDER — DIBUCAINE 1 % RE OINT: 1.0000 "application " | TOPICAL_OINTMENT | RECTAL | Status: DC | PRN

## 2012-03-29 MED ORDER — TETANUS-DIPHTH-ACELL PERTUSSIS 5-2.5-18.5 LF-MCG/0.5 IM SUSP
0.5000 mL | Freq: Once | INTRAMUSCULAR | Status: AC
  Administered 2012-03-29: 0.5 mL via INTRAMUSCULAR
  Filled 2012-03-29: qty 0.5

## 2012-03-29 MED ORDER — BENZOCAINE-MENTHOL 20-0.5 % EX AERO
1.0000 "application " | INHALATION_SPRAY | CUTANEOUS | Status: DC | PRN
  Administered 2012-03-29: 1 via TOPICAL
  Filled 2012-03-29: qty 56

## 2012-03-29 MED ORDER — PRENATAL MULTIVITAMIN CH
1.0000 | ORAL_TABLET | Freq: Every day | ORAL | Status: DC
  Administered 2012-03-29 – 2012-03-30 (×2): 1 via ORAL
  Filled 2012-03-29 (×2): qty 1

## 2012-03-29 MED ORDER — SENNOSIDES-DOCUSATE SODIUM 8.6-50 MG PO TABS
2.0000 | ORAL_TABLET | Freq: Every day | ORAL | Status: DC
  Administered 2012-03-29: 2 via ORAL

## 2012-03-29 NOTE — Progress Notes (Signed)
Patient ID: Judy Tate, female   DOB: 05-16-1986, 26 y.o.   MRN: [id number] #1 afebrile BP normal HGB stable

## 2012-03-29 NOTE — Anesthesia Postprocedure Evaluation (Signed)
  Anesthesia Post-op Note  Patient: Judy Tate  Procedure(s) Performed: * No procedures listed *  Patient Location: Mother/Baby  Anesthesia Type: Epidural  Level of Consciousness: awake, alert  and oriented  Airway and Oxygen Therapy: Patient Spontanous Breathing  Post-op Pain: mild  Post-op Assessment: Patient's Cardiovascular Status Stable, Respiratory Function Stable, Patent Airway, No signs of Nausea or vomiting and Pain level controlled  Post-op Vital Signs: stable  Complications: No apparent anesthesia complications

## 2012-03-30 MED ORDER — OXYCODONE-ACETAMINOPHEN 5-325 MG PO TABS: 1.0000 | ORAL_TABLET | Freq: Four times a day (QID) | ORAL | Status: AC | PRN

## 2012-03-30 MED ORDER — IBUPROFEN 600 MG PO TABS: 600.0000 mg | ORAL_TABLET | Freq: Four times a day (QID) | ORAL | Status: AC

## 2012-03-30 NOTE — Discharge Summary (Signed)
#  2 afebrile for d/c No complaints

## 2012-03-31 NOTE — Discharge Summary (Signed)
Judy Tate, Judy Tate              ACCOUNT NO.:  [id number]  MEDICAL RECORD NO.:  [id number]  LOCATION:  9133                          FACILITY:  [facility]  PHYSICIAN:  Malachi Pro. Ambrose Mantle, M.D. DATE OF BIRTH:  [birth year]  DATE OF ADMISSION:  03/28/2012 DATE OF DISCHARGE:  03/30/2012                              DISCHARGE SUMMARY   This is a 26 year old black female, para 2-0-1-2, gravida 4, [facility] April 03, 2012 presented for induction of labor given term status and multiparous history.  Prenatal care significant for positive group B strep status but no other issues.  Blood group and type A positive, negative antibody, rubella immune, RPR nonreactive, hepatitis B surface antigen negative, HIV negative, group B strep positive.  One hour Glucola 66.  First trimester screen and AFP normal.  PAST MEDICAL HISTORY:  Revealed history of Chlamydia infection.  She had no surgeries.  She did not smoke, drink, or take drugs.  No known allergies.  PHYSICAL EXAMINATION:  The cervix was soft, fingertip dilated, 40-50% effaced.  She was begun on penicillin for the group B strep and begun on Pitocin.  By 1:25 p.m. on March 28, 2012, Pitocin was at 15 milliunits a minute, contractions every 2 to 2.5 minutes, cervix 2+ cm, 70%, vertex - 2 and artificial rupture of the membranes produced clear fluid.  She received an epidural at 5:12 p.m. with a Pitocin at 19 milliunits a minute.  The cervix was 5 cm, 80% effaced, vertex at a -2 station.  The patient complained of some chest heaviness and shortness of breath.  At about 6 p.m., Dr. Arby Barrette saw the patient, epidural infusion was suspended.  The patient was noted to have some decelerations of the heart beat at 7:36 p.m.  Fluid position was changed.  Fluid bolus was given and by 7:48 p.m., the fetal heart rate was back to normal.  Cervix was 8 cm 80%.  By 8:58 p.m., the cervix was 8-9 cm, 80-90% effaced, vertex was at a 0 station.  At 10 p.m., the Pitocin was at 23  milliunits a minute.  Cervix was 9 cm, 90%, vertex at 0.  The patient became fully dilated, pushed well.  She delivered spontaneously LOA over an intact perineum, a living female infant 8 pounds 7 ounces, Apgars of 8 and 9 at 1 and 5 minutes.  There was 1 loop of nuchal cord that became tighter during the delivery.  There was some shoulder dystocia that was managed by McRoberts Wood screw and delivering the posterior arm.  Total time to delivery was less than 30 seconds.  The placenta was delivered intact. The uterus was normal.  There were no lacerations except a skid mark on the perineum that was not sutured.  Blood loss about 300 mL. Postpartum the patient did well and was discharged on the second postpartum day. Initial hemoglobin 11.3, hematocrit 35.0, Esquer count 7600, platelet count 226,000.  Followup hemoglobin 10.7.  FINAL DIAGNOSES:  Intrauterine pregnancy 39+ weeks, delivered left occipitoanterior, mild shoulder dystocia.  OPERATIONS:  Spontaneous delivery, LOA.  FINAL CONDITION:  Improved.  Instructions include our regular discharge instruction booklet.  Motrin 600 mg, 30 tablets, 1 every 6 hours  as needed for pain and Percocet 5/325, 30 tablets, 1 every 6 hours as needed for pain are given at discharge.  One refill on the Motrin.  The patient is advised that if I do the circumcision, it will need to be done within 2 weeks of delivery.     Malachi Pro. Ambrose Mantle, M.D.     TFH/MEDQ  D:  03/30/2012  T:  03/31/2012  Job:  [id number]

## 2012-03-31 NOTE — Progress Notes (Signed)
Post discharge chart review completed.  

## 2012-04-01 ENCOUNTER — Inpatient Hospital Stay (HOSPITAL_COMMUNITY): Admission: RE | Admit: 2012-04-01 | Payer: BC Managed Care – PPO | Source: Ambulatory Visit

## 2012-06-04 IMAGING — US US OB COMP LESS 14 WK
1 series · 14 of 27 positions shown · non-contrast
Comparison: none

[Series 1: us ob comp less 14 wks · 14 of 27 slices shown]
[im 1/27]
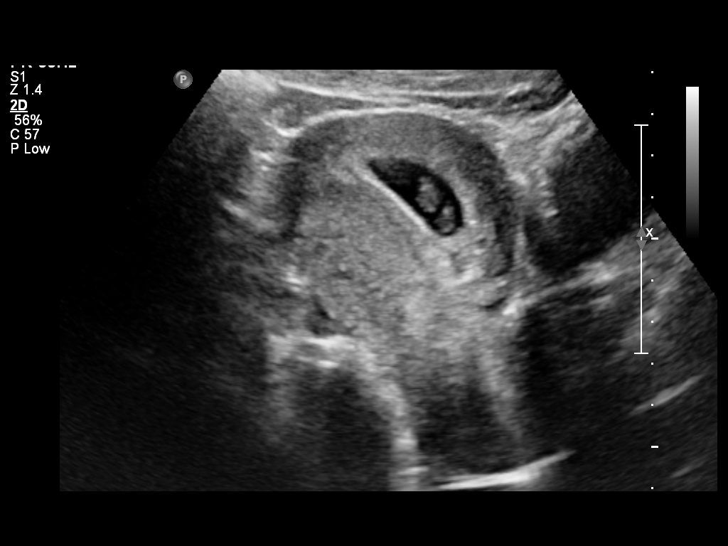
[im 3/27]
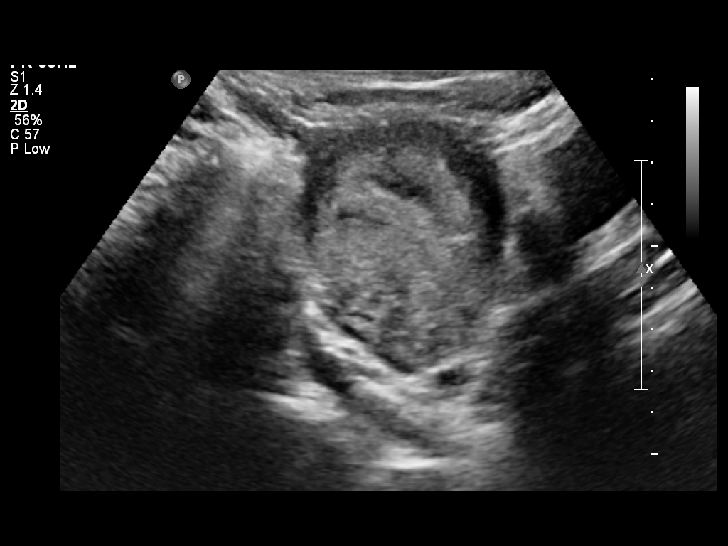
[im 5/27]
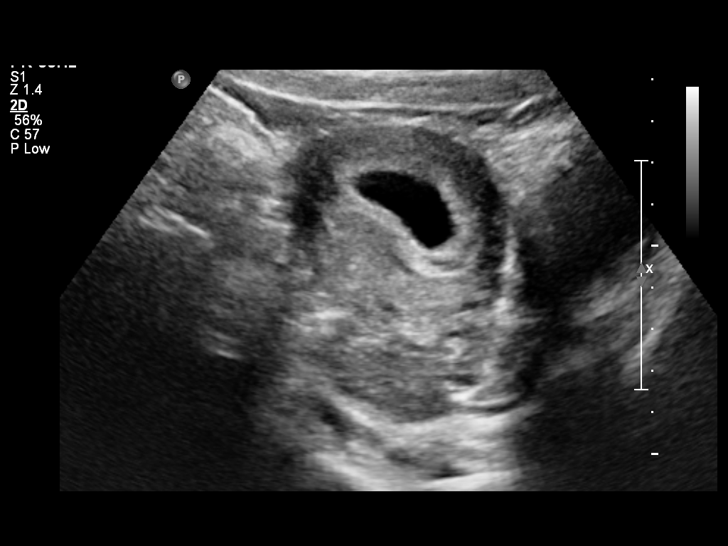
[im 7/27]
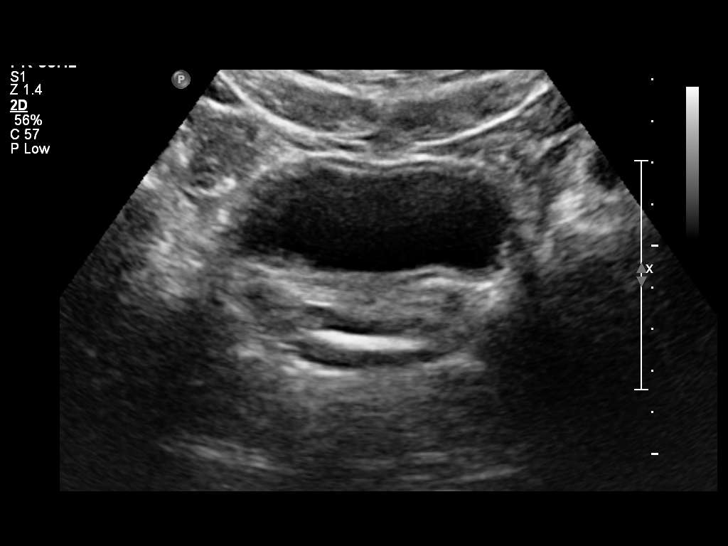
[im 9/27]
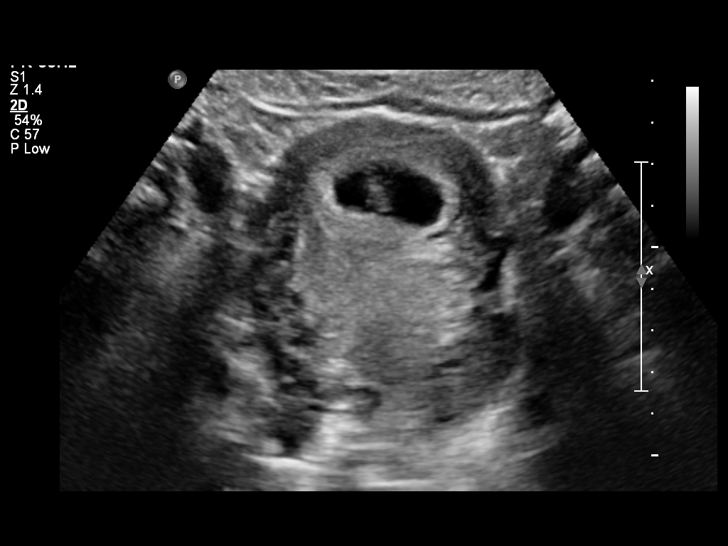
[im 11/27]
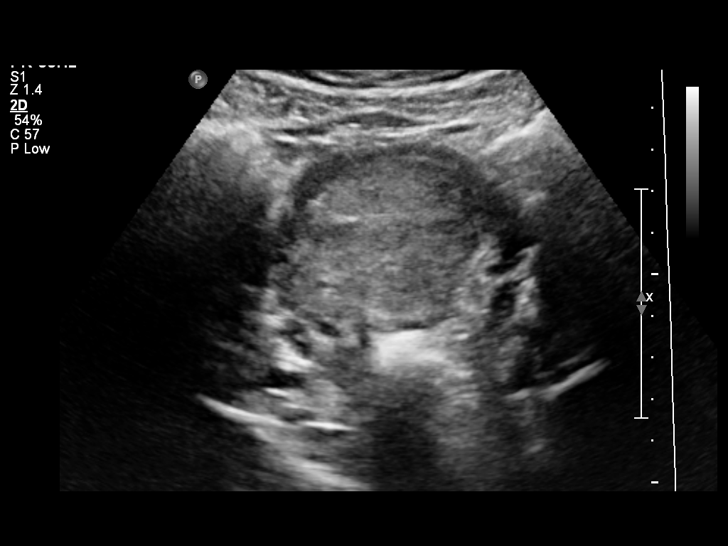
[im 13/27]
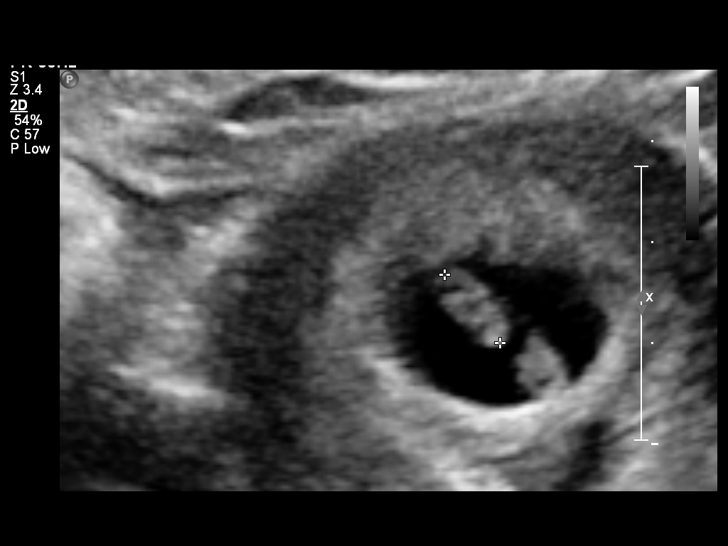
[im 15/27]
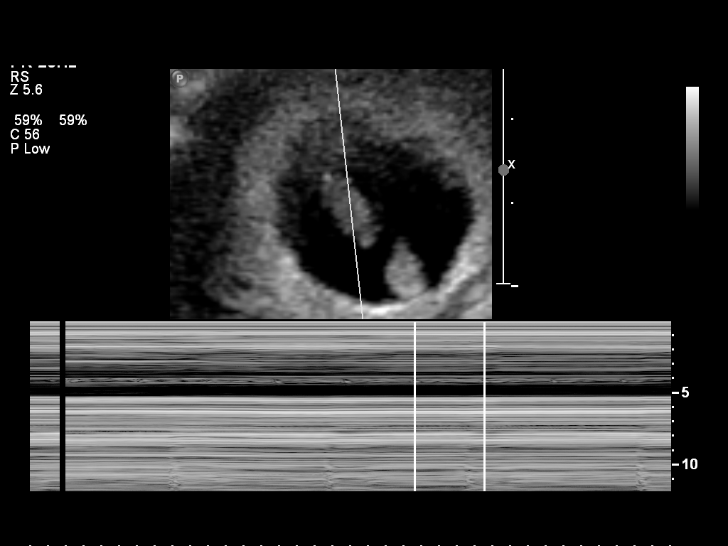
[im 17/27]
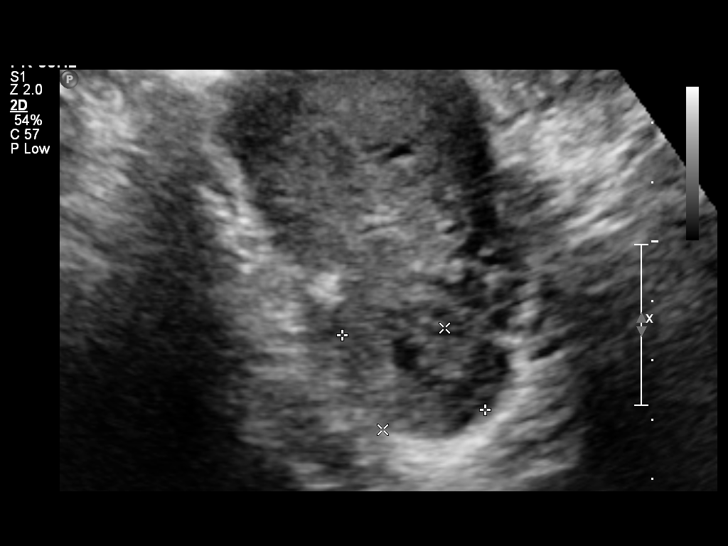
[im 19/27]
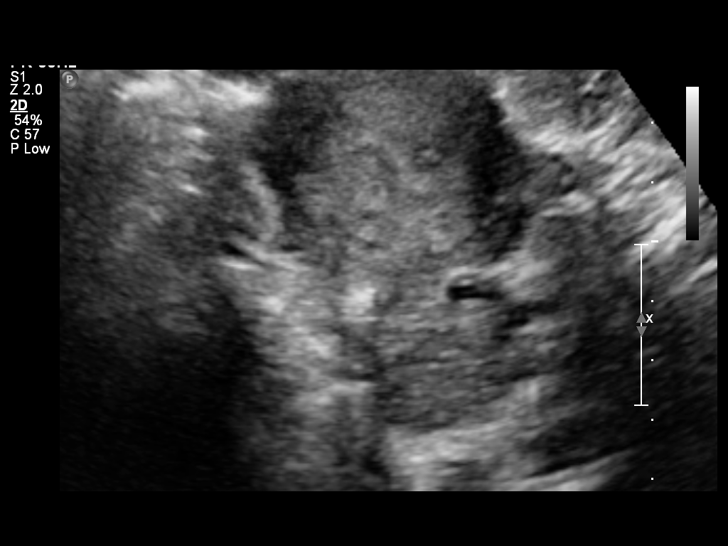
[im 21/27]
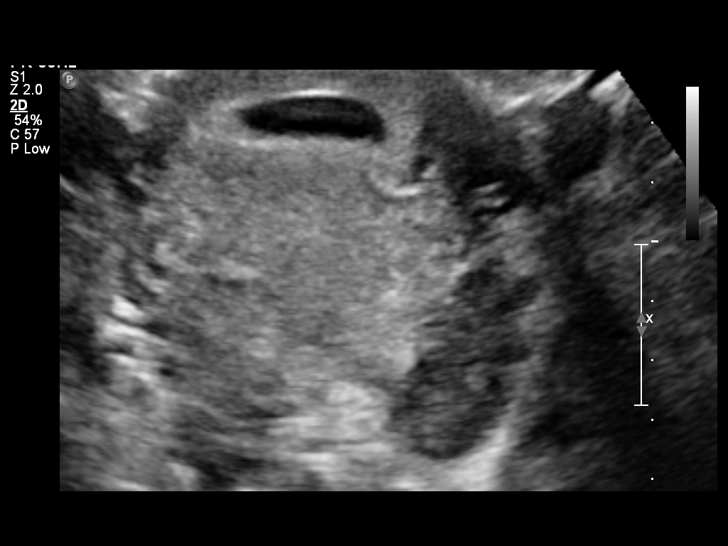
[im 23/27]
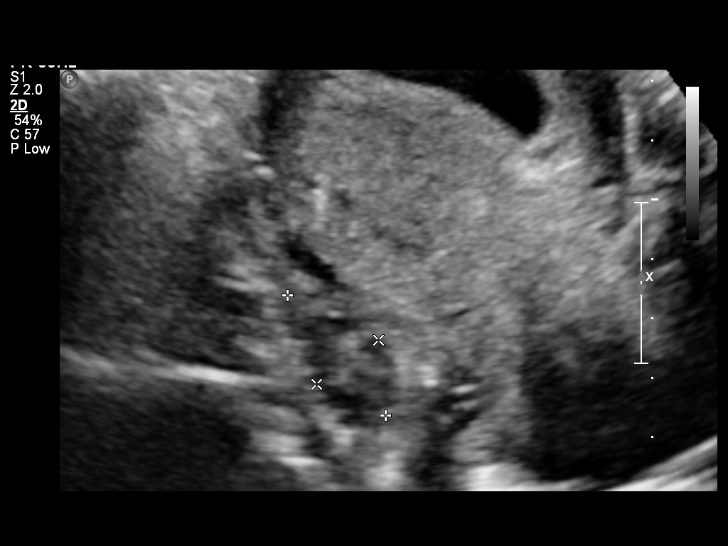
[im 25/27]
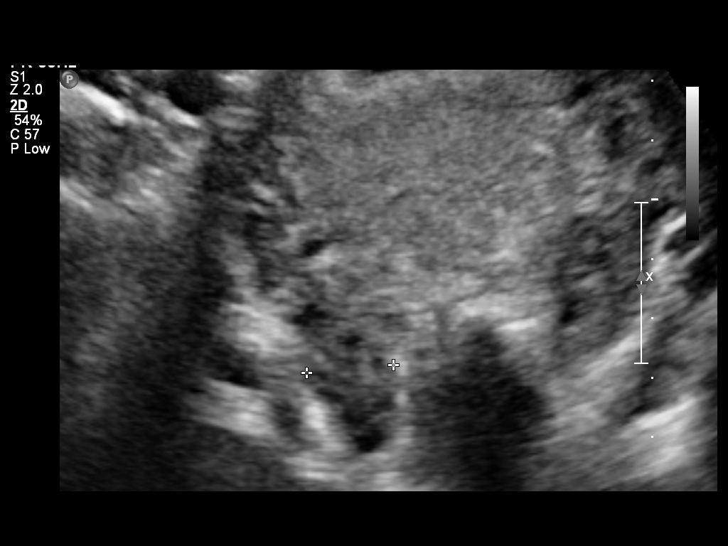
[im 27/27]
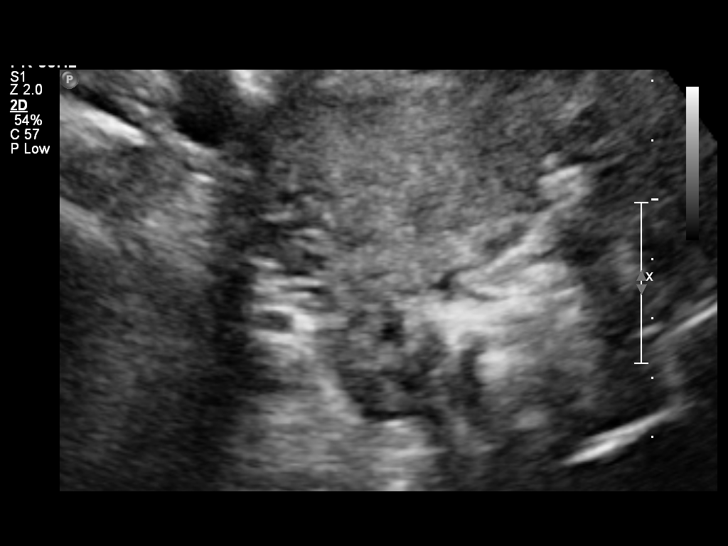

[14 of 27 positions shown; findings below may reference images not displayed]

OBSTETRICS REPORT
                      (Signed Final 08/20/2011 [DATE])

Procedures

 US OB COMP LESS 14 WKS                                76801.0
Indications

 Pelvic / Abdominal pain
 Unsure of LMP;  Establish Gestational [AGE]
Fetal Evaluation

 Gest. Sac:         Intrauterine
 Yolk Sac:          Visualized
 Fetal Pole:        Visualized
 Fetal Heart Rate:  153                         bpm
 Cardiac Activity:  Observed

 Amniotic Fluid
 AFI FV:      Subjectively within normal limits
Biometry

 CRL:        9  mm    G. Age:   6w 6d                  EDD:   04/08/12
Gestational Age

 LMP:           7w 4d        Date:   06/28/11                 EDD:   04/03/12
 Best:          6w 6d     Det. By:   U/S C R L (08/20/11)     EDD:   04/08/12
Cervix Uterus Adnexa

 Uterus:       No abnormality visualized.
 Cul De Sac:   No free fluid seen.
 Left Ovary:   Size(cm) L: 2.69 x W: 1.8 x H: 1.99  Volume(cc): 5
 Right Ovary:  Size(cm) L: 2.59 x W: 1.45 x H: 1.27  Volume(cc):
 Adnexa:     No abnormality visualized.
Impression

 Single living IUP with US Gest. Age of 6w 6d, and EDD of
 04/08/12.
 No significant maternal uterine or adnexal abnormality
 identified.

## 2013-02-18 ENCOUNTER — Encounter (HOSPITAL_COMMUNITY): Payer: Self-pay | Admitting: *Deleted

## 2013-02-18 ENCOUNTER — Inpatient Hospital Stay (HOSPITAL_COMMUNITY)
Admission: AD | Admit: 2013-02-18 | Discharge: 2013-02-18 | Disposition: A | Discharge status: Home / Self Care | Payer: Self-pay | Source: Ambulatory Visit | Attending: Obstetrics and Gynecology | Admitting: Obstetrics and Gynecology

## 2013-02-18 DIAGNOSIS — N949 Unspecified condition associated with female genital organs and menstrual cycle: Secondary | ICD-10-CM | POA: Insufficient documentation

## 2013-02-18 DIAGNOSIS — R109 Unspecified abdominal pain: Secondary | ICD-10-CM | POA: Insufficient documentation

## 2013-02-18 DIAGNOSIS — N938 Other specified abnormal uterine and vaginal bleeding: Secondary | ICD-10-CM | POA: Insufficient documentation

## 2013-02-18 DIAGNOSIS — Z975 Presence of (intrauterine) contraceptive device: Secondary | ICD-10-CM

## 2013-02-18 DIAGNOSIS — N921 Excessive and frequent menstruation with irregular cycle: Secondary | ICD-10-CM

## 2013-02-18 LAB — WET PREP, GENITAL: Clue Cells Wet Prep HPF POC: NONE SEEN

## 2013-02-18 LAB — CBC
HCT: 38.4 % (ref 36.0–46.0)
Hemoglobin: 12.8 g/dL (ref 12.0–15.0)
MCH: 27.9 pg (ref 26.0–34.0)
MCHC: 33.3 g/dL (ref 30.0–36.0)

## 2013-02-18 LAB — URINALYSIS, ROUTINE W REFLEX MICROSCOPIC
Bilirubin Urine: NEGATIVE
Glucose, UA: NEGATIVE mg/dL
Ketones, ur: NEGATIVE mg/dL
pH: 6 (ref 5.0–8.0)

## 2013-02-18 NOTE — MAU Provider Note (Signed)
History     CSN: [id number]  Arrival date and time: 02/18/13 1653   None     Chief Complaint  Patient presents with  . Vaginal Bleeding  . Breast Pain   HPI 27 y.o. [id number] with ongoing bleeding x 1.5 months, states only 2 days without bleeding, states bleeding is "heavy" changes pad 4-5 days/day. + pelvic cramping. Also c/o "cramping" under left breast and pain in left breast x 1.5 months. Has had implanon since last July.    Past Medical History  Diagnosis Date  . Pregnant state, incidental   . H/O chlamydia infection     Past Surgical History  Procedure Laterality Date  . No past surgeries      Family History  Problem Relation Age of Onset  . Diabetes Maternal Grandmother   . Anesthesia problems Neg Hx   . Cancer Mother     breast, colon  . Hypertension Father   . Cancer Maternal Uncle     liver  . Cancer Paternal Grandfather     liver  . Diabetes Cousin     History  Substance Use Topics  . Smoking status: Never Smoker   . Smokeless tobacco: Never Used  . Alcohol Use: No    Allergies: No Known Allergies  Prescriptions prior to admission  Medication Sig Dispense Refill  . calcium carbonate (TUMS - DOSED IN MG ELEMENTAL CALCIUM) 500 MG chewable tablet Chew 2 tablets by mouth daily as needed. For heartburn      . Prenatal Vit-Fe Fumarate-FA (PRENATAL MULTIVITAMIN) TABS Take 1 tablet by mouth every morning.         Review of Systems  Constitutional: Negative.   Respiratory: Negative.   Cardiovascular: Negative.   Gastrointestinal: Positive for abdominal pain. Negative for nausea, vomiting, diarrhea and constipation.  Genitourinary: Negative for dysuria, urgency, frequency, hematuria and flank pain.       Positive for vaginal bleeding, Negative for discharge   Musculoskeletal: Negative.   Neurological: Negative.   Psychiatric/Behavioral: Negative.    Physical Exam   Blood pressure 100/59, pulse 80, temperature 98.4 F (36.9 C), temperature  source Oral, resp. rate 20, height 5\' 3"  (1.6 m), weight 176 lb 6.4 oz (80.015 kg), SpO2 100.00%.  Physical Exam  Nursing note and vitals reviewed. Constitutional: She is oriented to person, place, and time. She appears well-developed and well-nourished. No distress.  Cardiovascular: Normal rate.   Respiratory: Effort normal.  GI: Soft. There is no tenderness.  Genitourinary: There is no rash, tenderness or lesion on the right labia. There is no rash, tenderness or lesion on the left labia. Uterus is not enlarged and not tender. Cervix exhibits no motion tenderness, no discharge and no friability. Right adnexum displays no mass, no tenderness and no fullness. Left adnexum displays no mass, no tenderness and no fullness. There is bleeding (small) around the vagina.  Musculoskeletal: Normal range of motion.  Neurological: She is alert and oriented to person, place, and time.  Skin: Skin is warm and dry.  Psychiatric: She has a normal mood and affect.    MAU Course  Procedures Results for orders placed during the hospital encounter of 02/18/13 (from the past 24 hour(s))  URINALYSIS, ROUTINE W REFLEX MICROSCOPIC     Status: Abnormal   Collection Time    02/18/13  5:15 PM      Result Value Range   Color, Urine YELLOW  YELLOW   APPearance CLEAR  CLEAR   Specific Gravity, Urine 1.020  1.005 -  1.030   pH 6.0  5.0 - 8.0   Glucose, UA NEGATIVE  NEGATIVE mg/dL   Hgb urine dipstick LARGE (*) NEGATIVE   Bilirubin Urine NEGATIVE  NEGATIVE   Ketones, ur NEGATIVE  NEGATIVE mg/dL   Protein, ur NEGATIVE  NEGATIVE mg/dL   Urobilinogen, UA 0.2  0.0 - 1.0 mg/dL   Nitrite NEGATIVE  NEGATIVE   Leukocytes, UA SMALL (*) NEGATIVE  URINE MICROSCOPIC-ADD ON     Status: Abnormal   Collection Time    02/18/13  5:15 PM      Result Value Range   Squamous Epithelial / LPF FEW (*) RARE   WBC, UA 0-2  <3 WBC/hpf   RBC / HPF 21-50  <3 RBC/hpf   Bacteria, UA FEW (*) RARE  POCT PREGNANCY, URINE     Status: None    Collection Time    02/18/13  5:27 PM      Result Value Range   Preg Test, Ur NEGATIVE  NEGATIVE  WET PREP, GENITAL     Status: Abnormal   Collection Time    02/18/13  6:00 PM      Result Value Range   Yeast Wet Prep HPF POC NONE SEEN  NONE SEEN   Trich, Wet Prep NONE SEEN  NONE SEEN   Clue Cells Wet Prep HPF POC NONE SEEN  NONE SEEN   WBC, Wet Prep HPF POC FEW (*) NONE SEEN  CBC     Status: None   Collection Time    02/18/13  6:01 PM      Result Value Range   WBC 7.9  4.0 - 10.5 K/uL   RBC 4.58  3.87 - 5.11 MIL/uL   Hemoglobin 12.8  12.0 - 15.0 g/dL   HCT 16.1  09.6 - 04.5 %   MCV 83.8  78.0 - 100.0 fL   MCH 27.9  26.0 - 34.0 pg   MCHC 33.3  30.0 - 36.0 g/dL   RDW 40.9  81.1 - 91.4 %   Platelets 314  150 - 400 K/uL     Assessment and Plan   1. Breakthrough bleeding on Nexplanon       Medication List    STOP taking these medications       calcium carbonate 500 MG chewable tablet  Commonly known as:  TUMS - dosed in mg elemental calcium     prenatal multivitamin Tabs      TAKE these medications       cetirizine 10 MG tablet  Commonly known as:  ZYRTEC  Take 10 mg by mouth daily.            Follow-up Information   Call [name], MD. (As needed)    Contact information:   [address], [address] [place] [address] [phone]         Judy Tate 02/18/2013, 5:35 PM

## 2013-02-18 NOTE — MAU Note (Signed)
Patient states she has an implant for birth control since July 2013. States she had bleeding after insertion but not for a while. States she has had irregular bleeding for about 1 1/2 months, steady bleeding for the past 4 days.. Has mild abdominal cramping with a lot of cramping under the left breast that started just before the bleeding started.

## 2013-04-11 ENCOUNTER — Emergency Department (HOSPITAL_COMMUNITY)
Admission: EM | Admit: 2013-04-11 | Discharge: 2013-04-11 | Disposition: A | Discharge status: Home / Self Care | Payer: Self-pay | Attending: Emergency Medicine | Admitting: Emergency Medicine

## 2013-04-11 ENCOUNTER — Encounter (HOSPITAL_COMMUNITY): Payer: Self-pay | Admitting: *Deleted

## 2013-04-11 DIAGNOSIS — J020 Streptococcal pharyngitis: Secondary | ICD-10-CM | POA: Insufficient documentation

## 2013-04-11 DIAGNOSIS — R059 Cough, unspecified: Secondary | ICD-10-CM | POA: Insufficient documentation

## 2013-04-11 DIAGNOSIS — Z8619 Personal history of other infectious and parasitic diseases: Secondary | ICD-10-CM | POA: Insufficient documentation

## 2013-04-11 DIAGNOSIS — R509 Fever, unspecified: Secondary | ICD-10-CM | POA: Insufficient documentation

## 2013-04-11 DIAGNOSIS — R05 Cough: Secondary | ICD-10-CM | POA: Insufficient documentation

## 2013-04-11 MED ORDER — PENICILLIN G BENZATHINE 1200000 UNIT/2ML IM SUSP
1.2000 10*6.[IU] | Freq: Once | INTRAMUSCULAR | Status: AC
  Administered 2013-04-11: 1.2 10*6.[IU] via INTRAMUSCULAR
  Filled 2013-04-11: qty 2

## 2013-04-11 NOTE — ED Provider Notes (Signed)
History    CSN: [id number] Arrival date & time 04/11/13  4098  First MD Initiated Contact with Patient 04/11/13 [phone]     Chief Complaint  Patient presents with  . Sore Throat   (Consider location/radiation/quality/duration/timing/severity/associated sxs/prior Treatment) HPI Comments: Patient is a 27 year old female who presents with a 1 month history of sore throat. Patient reports gradual onset and progressively worsening sharp, severe throat pain. The pain is constant and made worse with swallowing. The pain is localized to the patient's throat and equal on both sides. Nothing alleviates the pain. The patient has not tried anything for symptom relief. Patient reports associated subjective fever, cervical adenopathy, and non productive cough. Patient denies headache, visual changes, sinus congestion, difficulty breathing, chest pain, SOB, abdominal pain, NVD.     Past Medical History  Diagnosis Date  . Pregnant state, incidental   . H/O chlamydia infection    Past Surgical History  Procedure Laterality Date  . No past surgeries     Family History  Problem Relation Age of Onset  . Diabetes Maternal Grandmother   . Anesthesia problems Neg Hx   . Cancer Mother     breast, colon  . Hypertension Father   . Cancer Maternal Uncle     liver  . Cancer Paternal Grandfather     liver  . Diabetes Cousin    History  Substance Use Topics  . Smoking status: Never Smoker   . Smokeless tobacco: Never Used  . Alcohol Use: No   OB History   Grav Para Term Preterm Abortions TAB SAB Ect Mult Living   4 3 3  0 1 0 1 0 0 3     Review of Systems  HENT: Positive for sore throat.   All other systems reviewed and are negative.    Allergies  Review of patient's allergies indicates no known allergies.  Home Medications  No current outpatient prescriptions on file. BP 128/82  Pulse 84  Temp(Src) 98.5 F (36.9 C) (Oral)  Resp 16  SpO2 95% Physical Exam  Nursing note and vitals  reviewed. Constitutional: She is oriented to person, place, and time. She appears well-developed and well-nourished. No distress.  HENT:  Head: Normocephalic and atraumatic.  Tonsillar edema and erythema with bilateral exudate.   Eyes: Conjunctivae and EOM are normal. Pupils are equal, round, and reactive to light.  Neck: Normal range of motion.  Cardiovascular: Normal rate and regular rhythm.  Exam reveals no gallop and no friction rub.   No murmur heard. Pulmonary/Chest: Effort normal and breath sounds normal. She has no wheezes. She has no rales. She exhibits no tenderness.  Abdominal: Soft. She exhibits no distension. There is no tenderness. There is no rebound.  Musculoskeletal: Normal range of motion.  Lymphadenopathy:    She has cervical adenopathy.  Neurological: She is alert and oriented to person, place, and time. Coordination normal.  Speech is goal-oriented. Moves limbs without ataxia.   Skin: Skin is warm and dry.  Psychiatric: She has a normal mood and affect. Her behavior is normal.    ED Course  Procedures (including critical care time) Labs Reviewed  RAPID STREP SCREEN - Abnormal; Notable for the following:    Streptococcus, Group A Screen (Direct) POSITIVE (*)    All other components within normal limits   No results found. 1. Strep pharyngitis     MDM  10:24 AM Rapid strep pending. Vitals stable and patient afebrile.   10:35 AM I will treated patient  for strep throat based on clinical appearance. Patient instructed to follow up with PCP as needed and return to the ED with worsening or concerning symptoms.   Emilia Beck, PA-C 04/11/13 1526

## 2013-04-11 NOTE — ED Notes (Signed)
Pt escorted to discharge window. Pt verbalized understanding discharge instructions. In no acute distress.  

## 2013-04-11 NOTE — ED Notes (Signed)
Pt alert and oriented x4. Respirations even and unlabored, bilateral symmetrical rise and fall of chest. Skin warm and dry. In no acute distress. Denies needs.   

## 2013-04-11 NOTE — ED Notes (Signed)
Pt reports sore throat x1 month. 8/10 pain. Occasionally difficulty swallowing and pain sometimes radiates up to ears.

## 2013-04-12 NOTE — ED Provider Notes (Signed)
Medical screening examination/treatment/procedure(s) were performed by non-physician practitioner and as supervising physician I was immediately available for consultation/collaboration.   Avantae Bither L Chekesha Behlke, MD 04/12/13 0845 

## 2013-06-21 ENCOUNTER — Emergency Department (HOSPITAL_COMMUNITY)
Admission: EM | Admit: 2013-06-21 | Discharge: 2013-06-21 | Disposition: A | Discharge status: Home / Self Care | Payer: BC Managed Care – PPO | Attending: Emergency Medicine | Admitting: Emergency Medicine

## 2013-06-21 ENCOUNTER — Encounter (HOSPITAL_COMMUNITY): Payer: Self-pay | Admitting: Emergency Medicine

## 2013-06-21 DIAGNOSIS — Z3202 Encounter for pregnancy test, result negative: Secondary | ICD-10-CM | POA: Insufficient documentation

## 2013-06-21 DIAGNOSIS — N938 Other specified abnormal uterine and vaginal bleeding: Secondary | ICD-10-CM | POA: Insufficient documentation

## 2013-06-21 DIAGNOSIS — N939 Abnormal uterine and vaginal bleeding, unspecified: Secondary | ICD-10-CM

## 2013-06-21 DIAGNOSIS — N949 Unspecified condition associated with female genital organs and menstrual cycle: Secondary | ICD-10-CM | POA: Insufficient documentation

## 2013-06-21 LAB — CBC WITH DIFFERENTIAL/PLATELET
Eosinophils Relative: 6 % — ABNORMAL HIGH (ref 0–5)
HCT: 39.6 % (ref 36.0–46.0)
Hemoglobin: 13.2 g/dL (ref 12.0–15.0)
Lymphocytes Relative: 38 % (ref 12–46)
Lymphs Abs: 2 10*3/uL (ref 0.7–4.0)
MCV: 84.4 fL (ref 78.0–100.0)
Monocytes Absolute: 0.4 10*3/uL (ref 0.1–1.0)
RBC: 4.69 MIL/uL (ref 3.87–5.11)
WBC: 5.3 10*3/uL (ref 4.0–10.5)

## 2013-06-21 LAB — WET PREP, GENITAL
Trich, Wet Prep: NONE SEEN
Yeast Wet Prep HPF POC: NONE SEEN

## 2013-06-21 LAB — BASIC METABOLIC PANEL
CO2: 24 mEq/L (ref 19–32)
Calcium: 9.2 mg/dL (ref 8.4–10.5)
Creatinine, Ser: 0.68 mg/dL (ref 0.50–1.10)
Glucose, Bld: 86 mg/dL (ref 70–99)

## 2013-06-21 LAB — URINALYSIS, ROUTINE W REFLEX MICROSCOPIC
Specific Gravity, Urine: 1.017 (ref 1.005–1.030)
Urobilinogen, UA: 0.2 mg/dL (ref 0.0–1.0)

## 2013-06-21 LAB — PREGNANCY, URINE: Preg Test, Ur: NEGATIVE

## 2013-06-21 LAB — ABO/RH: ABO/RH(D): A POS

## 2013-06-21 LAB — URINE MICROSCOPIC-ADD ON

## 2013-06-21 LAB — TYPE AND SCREEN: Antibody Screen: NEGATIVE

## 2013-06-21 LAB — HIV ANTIBODY (ROUTINE TESTING W REFLEX): HIV: NONREACTIVE

## 2013-06-21 MED ORDER — SODIUM CHLORIDE 0.9 % IV SOLN
INTRAVENOUS | Status: DC
  Administered 2013-06-21: 10:00:00 via INTRAVENOUS

## 2013-06-21 MED ORDER — SODIUM CHLORIDE 0.9 % IV BOLUS (SEPSIS)
500.0000 mL | Freq: Once | INTRAVENOUS | Status: AC
  Administered 2013-06-21: 500 mL via INTRAVENOUS

## 2013-06-21 NOTE — ED Notes (Signed)
Pt from home reports that she has had vaginal bleeding x7 months. Pt adds that the bleeding with normal flow like a period, but has not been continuous but stops for 2-3 days approx every week. Pt states that now she is having abd pain x1 week with a HA. Pt denies N/V/D, fever, CP, SOB, dizziness. Pt is A&O and in NAD.

## 2013-06-21 NOTE — ED Provider Notes (Signed)
CSN: [id number]     Arrival date & time 06/21/13  7829 History   First MD Initiated Contact with Patient 06/21/13 [phone]     Chief Complaint  Patient presents with  . Vaginal Bleeding   (Consider location/radiation/quality/duration/timing/severity/associated sxs/prior Treatment) HPI Comments: Judy Tate is a 27 y.o. Female who presents for evaluation of vaginal bleeding. The bleeding started after placement of a progestin contraceptive implant. The bleeding is intermittent, and feels like menses. She denies fever, chills, nausea, vomiting, weakness, or dizziness. She contacted her gynecologist 10 days ago. Her gynecologist has recommended that she have an ultrasound. There are plans to have this done, after her Medicaid is approved. There are no other known modifying factors.   Patient is a 27 y.o. female presenting with vaginal bleeding. The history is provided by the patient.  Vaginal Bleeding   Past Medical History  Diagnosis Date  . Pregnant state, incidental   . H/O chlamydia infection    Past Surgical History  Procedure Laterality Date  . No past surgeries     Family History  Problem Relation Age of Onset  . Diabetes Maternal Grandmother   . Anesthesia problems Neg Hx   . Cancer Mother     breast, colon  . Hypertension Father   . Cancer Maternal Uncle     liver  . Cancer Paternal Grandfather     liver  . Diabetes Cousin    History  Substance Use Topics  . Smoking status: Never Smoker   . Smokeless tobacco: Never Used  . Alcohol Use: No   OB History   Grav Para Term Preterm Abortions TAB SAB Ect Mult Living   4 3 3  0 1 0 1 0 0 3     Review of Systems  Genitourinary: Positive for vaginal bleeding.  All other systems reviewed and are negative.    Allergies  Review of patient's allergies indicates no known allergies.  Home Medications  No current outpatient prescriptions on file. BP 104/61  Pulse 62  Temp(Src) 98.1 F (36.7 C) (Oral)  Resp 16   SpO2 99%  LMP 06/21/2013  Breastfeeding? No Physical Exam  Nursing note and vitals reviewed. Constitutional: She is oriented to person, place, and time. She appears well-developed and well-nourished. No distress.  HENT:  Head: Normocephalic and atraumatic.  Eyes: Conjunctivae and EOM are normal. Pupils are equal, round, and reactive to light.  Neck: Normal range of motion and phonation normal. Neck supple.  Cardiovascular: Normal rate, regular rhythm and intact distal pulses.   Pulmonary/Chest: Effort normal and breath sounds normal. She exhibits no tenderness.  Abdominal: Soft. She exhibits no distension and no mass. There is no tenderness. There is no guarding.  Genitourinary:  Normal external female genitalia. Blood in vagina emanates from the cervical os. No cervical motion tenderness. Uterus is involuted. No adnexal mass or palpable ovary. No significant tenderness on bimanual examination.  Musculoskeletal: Normal range of motion.  Neurological: She is alert and oriented to person, place, and time. She exhibits normal muscle tone.  Skin: Skin is warm and dry.  Psychiatric: She has a normal mood and affect. Her behavior is normal. Judgment and thought content normal.    ED Course  Procedures (including critical care time)        Labs Review Labs Reviewed  WET PREP, GENITAL - Abnormal; Notable for the following:    Clue Cells Wet Prep HPF POC FEW (*)    WBC, Wet Prep HPF POC FEW (*)  All other components within normal limits  CBC WITH DIFFERENTIAL - Abnormal; Notable for the following:    Eosinophils Relative 6 (*)    All other components within normal limits  URINALYSIS, ROUTINE W REFLEX MICROSCOPIC - Abnormal; Notable for the following:    Hgb urine dipstick LARGE (*)    Leukocytes, UA TRACE (*)    All other components within normal limits  GC/CHLAMYDIA PROBE AMP  BASIC METABOLIC PANEL  PREGNANCY, URINE  URINE MICROSCOPIC-ADD ON  RPR  HIV ANTIBODY (ROUTINE  TESTING)  TYPE AND SCREEN  ABO/RH   Imaging Review No results found.  MDM   1. Abnormal vaginal bleeding    Abnormal vaginal bleeding, with normal hemoglobin and hemodynamically is stable. She can be discharged with outpatient followup with GYN  Nursing Notes Reviewed/ Care Coordinated, and agree without changes. Applicable Imaging Reviewed.  Interpretation of Laboratory Data incorporated into ED treatment   Plan: Home Medications- none; Home Treatments and Observation- rest; return here if the recommended treatment, does not improve the symptoms; Recommended follow up- GYN. Followup 1-2 weeks      Flint Melter, MD 06/21/13 [phone]

## 2013-07-09 ENCOUNTER — Emergency Department (INDEPENDENT_AMBULATORY_CARE_PROVIDER_SITE_OTHER)
Admission: EM | Admit: 2013-07-09 | Discharge: 2013-07-09 | Disposition: A | Discharge status: Home / Self Care | Payer: Self-pay | Source: Home / Self Care | Attending: Emergency Medicine | Admitting: Emergency Medicine

## 2013-07-09 ENCOUNTER — Encounter (HOSPITAL_COMMUNITY): Payer: Self-pay | Admitting: Emergency Medicine

## 2013-07-09 DIAGNOSIS — M549 Dorsalgia, unspecified: Secondary | ICD-10-CM

## 2013-07-09 LAB — POCT PREGNANCY, URINE: Preg Test, Ur: NEGATIVE

## 2013-07-09 LAB — POCT URINALYSIS DIP (DEVICE)
Ketones, ur: NEGATIVE mg/dL
Nitrite: NEGATIVE
Protein, ur: NEGATIVE mg/dL
Urobilinogen, UA: 0.2 mg/dL (ref 0.0–1.0)
pH: 6.5 (ref 5.0–8.0)

## 2013-07-09 MED ORDER — TRAMADOL HCL 50 MG PO TABS: 50.0000 mg | ORAL_TABLET | Freq: Four times a day (QID) | ORAL | Status: DC | PRN

## 2013-07-09 MED ORDER — NAPROXEN 500 MG PO TABS: 500.0000 mg | ORAL_TABLET | Freq: Two times a day (BID) | ORAL | Status: DC | PRN

## 2013-07-09 NOTE — ED Provider Notes (Signed)
CSN: [id number]     Arrival date & time 07/09/13  0831 History   First MD Initiated Contact with Patient 07/09/13 0912     Chief Complaint  Patient presents with  . Back Pain   (Consider location/radiation/quality/duration/timing/severity/associated sxs/prior Treatment) HPI Comments: 27 year old female presents complaining of left sided back pain for the past 2 days. The pain started in her left lower back and has since spread up the left side of her back to her neck. She feels like this involves the muscles to the left side of her back. She has been taking the occasional Advil for this which does not help. The onset was gradual, the pain is rated 7-8/10 and is constant. The pain is relieved by lying on a pillow and is exacerbated by getting up and moving around. She has never experienced this pain before. She denies dysuria, hematuria, as in any extremities, fever, chills, NVD, abdominal pain, injury, loss of bowel or bladder control, perineal numbness.  Patient is a 27 y.o. female presenting with back pain.  Back Pain Associated symptoms: no abdominal pain, no chest pain, no dysuria, no fever and no weakness     Past Medical History  Diagnosis Date  . Pregnant state, incidental   . H/O chlamydia infection    Past Surgical History  Procedure Laterality Date  . No past surgeries     Family History  Problem Relation Age of Onset  . Diabetes Maternal Grandmother   . Anesthesia problems Neg Hx   . Cancer Mother     breast, colon  . Hypertension Father   . Cancer Maternal Uncle     liver  . Cancer Paternal Grandfather     liver  . Diabetes Cousin    History  Substance Use Topics  . Smoking status: Never Smoker   . Smokeless tobacco: Never Used  . Alcohol Use: No   OB History   Grav Para Term Preterm Abortions TAB SAB Ect Mult Living   4 3 3  0 1 0 1 0 0 3     Review of Systems  Constitutional: Negative for fever and chills.  Eyes: Negative for visual disturbance.   Respiratory: Negative for cough and shortness of breath.   Cardiovascular: Negative for chest pain, palpitations and leg swelling.  Gastrointestinal: Negative for nausea, vomiting and abdominal pain.  Endocrine: Negative for polydipsia and polyuria.  Genitourinary: Negative for dysuria, urgency and frequency.  Musculoskeletal: Positive for back pain. Negative for arthralgias and myalgias.  Skin: Negative for rash.  Neurological: Negative for dizziness, weakness and light-headedness.    Allergies  Review of patient's allergies indicates no known allergies.  Home Medications   Current Outpatient Rx  Name  Route  Sig  Dispense  Refill  . naproxen (NAPROSYN) 500 MG tablet   Oral   Take 1 tablet (500 mg total) by mouth 2 (two) times daily as needed.   60 tablet   0   . traMADol (ULTRAM) 50 MG tablet   Oral   Take 1 tablet (50 mg total) by mouth every 6 (six) hours as needed for pain.   15 tablet   0    BP 103/61  Pulse 66  Temp(Src) 98.6 F (37 C) (Oral)  Resp 16  SpO2 99%  LMP 06/21/2013  Breastfeeding? No Physical Exam  Nursing note and vitals reviewed. Constitutional: She is oriented to person, place, and time. Vital signs are normal. She appears well-developed and well-nourished. No distress.  HENT:  Head: Normocephalic  and atraumatic.  Pulmonary/Chest: Effort normal. No respiratory distress.  Musculoskeletal:       Cervical back: She exhibits tenderness (left sided musculature), bony tenderness (Mild to moderate tenderness at the area of C7) and pain. She exhibits normal range of motion, no deformity and no spasm.       Thoracic back: She exhibits tenderness (left sided paraspinous musculature), pain and spasm. She exhibits normal range of motion, no bony tenderness, no swelling and no deformity.       Lumbar back: She exhibits tenderness (Left sided paraspinous musculature) and pain. She exhibits normal range of motion, no bony tenderness, no swelling, no deformity  and no spasm.  Neurological: She is alert and oriented to person, place, and time. She has normal strength. Coordination normal.  Skin: Skin is warm and dry. No rash noted. She is not diaphoretic.  Psychiatric: She has a normal mood and affect. Judgment normal.    ED Course  Procedures (including critical care time) Labs Review Labs Reviewed  POCT URINALYSIS DIP (DEVICE) - Abnormal; Notable for the following:    Leukocytes, UA TRACE (*)    All other components within normal limits  URINE CULTURE  POCT PREGNANCY, URINE   Imaging Review No results found.    MDM   1. Back pain    There are trace leukocytes in the urine, sending a urine culture to check for STD and possible pyelonephritis. I believe this is musculoskeletal back pain. Treat with naproxen twice a day when necessary and tramadol when necessary. She should followup if she is not improving or worsening.   Meds ordered this encounter  Medications  . naproxen (NAPROSYN) 500 MG tablet    Sig: Take 1 tablet (500 mg total) by mouth 2 (two) times daily as needed.    Dispense:  60 tablet    Refill:  0    Order Specific Question:  Supervising Provider    Answer:  Lorenz Coaster, DAVID C [id number]  . traMADol (ULTRAM) 50 MG tablet    Sig: Take 1 tablet (50 mg total) by mouth every 6 (six) hours as needed for pain.    Dispense:  15 tablet    Refill:  0    Order Specific Question:  Supervising Provider    Answer:  Lorenz Coaster, DAVID C [[id number]]       Graylon Good, PA-C 07/09/13 [phone]

## 2013-07-09 NOTE — ED Provider Notes (Signed)
Medical screening examination/treatment/procedure(s) were performed by non-physician practitioner and as supervising physician I was immediately available for consultation/collaboration.  Leslee Home, M.D.  Reuben Likes, MD 07/09/13 [phone]

## 2013-07-09 NOTE — ED Notes (Signed)
Pt c/o lower back and upper back pain onset 2 days Pain increases w/activity Denies: inj/trauma, strenuous activity, urinary sxs, gyn sxs Alert w/no signs of acute distress.

## 2013-07-10 LAB — URINE CULTURE

## 2013-09-27 ENCOUNTER — Emergency Department (HOSPITAL_COMMUNITY)
Admission: EM | Admit: 2013-09-27 | Discharge: 2013-09-27 | Disposition: A | Discharge status: Home / Self Care | Payer: BC Managed Care – PPO | Attending: Emergency Medicine | Admitting: Emergency Medicine

## 2013-09-27 ENCOUNTER — Encounter (HOSPITAL_COMMUNITY): Payer: Self-pay | Admitting: Emergency Medicine

## 2013-09-27 DIAGNOSIS — IMO0001 Reserved for inherently not codable concepts without codable children: Secondary | ICD-10-CM | POA: Insufficient documentation

## 2013-09-27 DIAGNOSIS — J111 Influenza due to unidentified influenza virus with other respiratory manifestations: Secondary | ICD-10-CM | POA: Insufficient documentation

## 2013-09-27 DIAGNOSIS — R52 Pain, unspecified: Secondary | ICD-10-CM | POA: Insufficient documentation

## 2013-09-27 DIAGNOSIS — R6889 Other general symptoms and signs: Secondary | ICD-10-CM

## 2013-09-27 DIAGNOSIS — Z8619 Personal history of other infectious and parasitic diseases: Secondary | ICD-10-CM | POA: Insufficient documentation

## 2013-09-27 DIAGNOSIS — J029 Acute pharyngitis, unspecified: Secondary | ICD-10-CM | POA: Insufficient documentation

## 2013-09-27 MED ORDER — GUAIFENESIN-CODEINE 100-10 MG/5ML PO SOLN: 5.0000 mL | Freq: Three times a day (TID) | ORAL | Status: DC | PRN

## 2013-09-27 NOTE — Discharge Instructions (Signed)
Maintain hydration by drinking small amounts of clear fluids frequently, then soft diet, and then advance diet as tolerated. May use OTC Imodium if desired for any diarrhea.  Call if symptoms worsen, high fever, severe weakness or fainting, increased abdominal pain, blood in stool or vomit, or failure to improve in 2-3 days.   Influenza, Adult Influenza ("the flu") is a viral infection of the respiratory tract. It occurs more often in winter months because people spend more time in close contact with one another. Influenza can make you feel very sick. Influenza easily spreads from person to person (contagious). CAUSES  Influenza is caused by a virus that infects the respiratory tract. You can catch the virus by breathing in droplets from an infected person's cough or sneeze. You can also catch the virus by touching something that was recently contaminated with the virus and then touching your mouth, nose, or eyes. SYMPTOMS  Symptoms typically last 4 to 10 days and may include:  Fever.  Chills.  Headache, body aches, and muscle aches.  Sore throat.  Chest discomfort and cough.  Poor appetite.  Weakness or feeling tired.  Dizziness.  Nausea or vomiting. DIAGNOSIS  Diagnosis of influenza is often made based on your history and a physical exam. A nose or throat swab test can be done to confirm the diagnosis. RISKS AND COMPLICATIONS You may be at risk for a more severe case of influenza if you smoke cigarettes, have diabetes, have chronic heart disease (such as heart failure) or lung disease (such as asthma), or if you have a weakened immune system. Elderly people and pregnant women are also at risk for more serious infections. The most common complication of influenza is a lung infection (pneumonia). Sometimes, this complication can require emergency medical care and may be life-threatening. PREVENTION  An annual influenza vaccination (flu shot) is the best way to avoid getting influenza.  An annual flu shot is now routinely recommended for all adults in the [place] TREATMENT  In mild cases, influenza goes away on its own. Treatment is directed at relieving symptoms. For more severe cases, your caregiver may prescribe antiviral medicines to shorten the sickness. Antibiotic medicines are not effective, because the infection is caused by a virus, not by bacteria. HOME CARE INSTRUCTIONS  Only take over-the-counter or prescription medicines for pain, discomfort, or fever as directed by your caregiver.  Use a cool mist humidifier to make breathing easier.  Get plenty of rest until your temperature returns to normal. This usually takes 3 to 4 days.  Drink enough fluids to keep your urine clear or pale yellow.  Cover your mouth and nose when coughing or sneezing, and wash your hands well to avoid spreading the virus.  Stay home from work or school until your fever has been gone for at least 1 full day. SEEK MEDICAL CARE IF:   You have chest pain or a deep cough that worsens or produces more mucus.  You have nausea, vomiting, or diarrhea. SEEK IMMEDIATE MEDICAL CARE IF:   You have difficulty breathing, shortness of breath, or your skin or nails turn bluish.  You have severe neck pain or stiffness.  You have a severe headache, facial pain, or earache.  You have a worsening or recurring fever.  You have nausea or vomiting that cannot be controlled. MAKE SURE YOU:  Understand these instructions.  Will watch your condition.  Will get help right away if you are not doing well or get worse. Document Released: 09/07/2000 Document  Revised: 03/11/2012 Document Reviewed: 12/10/2011 [facility] Patient Information 2014 [place], [place].

## 2013-09-27 NOTE — ED Notes (Signed)
Pt complains of generalized body aches, sore throat and cough since this am.

## 2013-09-27 NOTE — ED Provider Notes (Signed)
CSN: [id number]     Arrival date & time 09/27/13  1754 History  This chart was scribed for Marlon Pel, PA-C, working with Rolland Porter, MD, by Ardelia Mems ED Scribe. This patient was seen in room WTR8/WTR8 and the patient's care was started at 7:48 PM.   Chief Complaint  Patient presents with  . Generalized Body Aches  . Sore Throat  . Cough    The history is provided by the patient. No language interpreter was used.    HPI Comments: Judy Tate is a 28 y.o. female who presents to the Emergency Department complaining of a sore throat onset this morning. She reports associated generalized myalgias, fever, chills and a cough- all onset this morning. ED temperature was 101.8 F upon arrival. She further reports an associated generalized headache onset today, and states that she has eben "breathing heavy" at times today. 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 She states that she did not receive this season's flu vaccine. She states that she has no known medication allergies.   Past Medical History  Diagnosis Date  . Pregnant state, incidental   . H/O chlamydia infection    Past Surgical History  Procedure Laterality Date  . No past surgeries     Family History  Problem Relation Age of Onset  . Diabetes Maternal Grandmother   . Anesthesia problems Neg Hx   . Cancer Mother     breast, colon  . Hypertension Father   . Cancer Maternal Uncle     liver  . Cancer Paternal Grandfather     liver  . Diabetes Cousin    History  Substance Use Topics  . Smoking status: Never Smoker   . Smokeless tobacco: Never Used  . Alcohol Use: No   OB History   Grav Para Term Preterm Abortions TAB SAB Ect Mult Living   4 3 3  0 1 0 1 0 0 3     Review of Systems  Constitutional: Positive for fever and chills.  HENT: Positive for sore throat.   Respiratory: Positive for cough.   Musculoskeletal: Positive for myalgias (generalized myalgias).   Neurological: Positive for headaches.  All other systems reviewed and are negative.   Allergies  Review of patient's allergies indicates no known allergies.  Home Medications   Current Outpatient Rx  Name  Route  Sig  Dispense  Refill  . guaiFENesin-codeine 100-10 MG/5ML syrup   Oral   Take 5-10 mLs by mouth 3 (three) times daily as needed for cough.   120 mL   0   . naproxen (NAPROSYN) 500 MG tablet   Oral   Take 1 tablet (500 mg total) by mouth 2 (two) times daily as needed.   60 tablet   0   . traMADol (ULTRAM) 50 MG tablet   Oral   Take 1 tablet (50 mg total) by mouth every 6 (six) hours as needed for pain.   15 tablet   0    Triage Vitals: BP 101/51  Pulse 108  Temp(Src) 101.6 F (38.7 C) (Oral)  Resp 16  SpO2 99%  Physical Exam  Nursing note and vitals reviewed. Constitutional: She is oriented to person, place, and time. She appears well-developed and well-nourished. No distress.  HENT:  Head: Normocephalic and atraumatic.  Right Ear: Tympanic membrane, external ear and ear canal normal.  Left Ear: Tympanic membrane, external ear and ear canal normal.  Nose: Nose normal.  No rhinorrhea. Right sinus exhibits no maxillary sinus tenderness and no frontal sinus tenderness. Left sinus exhibits no maxillary sinus tenderness and no frontal sinus tenderness.  Mouth/Throat: Uvula is midline and mucous membranes are normal. No trismus in the jaw. Normal dentition. No dental abscesses or uvula swelling. Oropharyngeal exudate and posterior oropharyngeal edema present. No posterior oropharyngeal erythema or tonsillar abscesses.  No submental edema, tongue not elevated, no trismus. No impending airway obstruction; Pt able to speak full sentences, swallow intact, no drooling, stridor, or tonsillar/uvula displacement. No palatal petechia  Eyes: Conjunctivae and EOM are normal.  Neck: Trachea normal, normal range of motion and full passive range of motion without pain. Neck  supple. No rigidity. No tracheal deviation and normal range of motion present. No Brudzinski's sign noted.  Flexion and extension of neck without pain or difficulty. Able to breath without difficulty in extension.  Cardiovascular: Normal rate and regular rhythm.   Pulmonary/Chest: Effort normal and breath sounds normal. No stridor. No respiratory distress. She has no wheezes.  Abdominal: Soft. There is no tenderness.  No obvious evidence of splenomegaly. Non ttp.   Musculoskeletal: Normal range of motion.  Lymphadenopathy:       Head (right side): No preauricular and no posterior auricular adenopathy present.       Head (left side): No preauricular and no posterior auricular adenopathy present.    She has cervical adenopathy.  Neurological: She is alert and oriented to person, place, and time.  Skin: Skin is warm and dry. No rash noted. She is not diaphoretic.  Psychiatric: She has a normal mood and affect. Her behavior is normal.    ED Course  Procedures (including critical care time)  DIAGNOSTIC STUDIES: Oxygen Saturation is 99% on RA, normal by my interpretation.    COORDINATION OF CARE: 7:55 PM- Discussed clinical suspicion that pt has the flu. Will discharge with medications. Pt advised of plan for treatment and pt agrees.  Labs Review Labs Reviewed - No data to display Imaging Review No results found.  EKG Interpretation   None       MDM   1. Flu-like symptoms    27 y.o.Judy Tate's evaluation in the Emergency Department is complete. It has been determined that no acute conditions requiring further emergency intervention are present at this time. The patient/guardian have been advised of the diagnosis and plan. We have discussed signs and symptoms that warrant return to the ED, such as changes or worsening in symptoms.  Vital signs are stable at discharge. Filed Vitals:   09/27/13 1944  BP: 101/51  Pulse: 108  Temp: 101.6 F (38.7 C)  Resp: 16     Patient/guardian has voiced understanding and agreed to follow-up with the PCP or specialist.  I personally performed the services described in this documentation, which was scribed in my presence. The recorded information has been reviewed and is accurate.   Dorthula Matasiffany G Nikolaos Maddocks, PA-C 09/27/13 2000

## 2013-10-10 NOTE — ED Provider Notes (Signed)
Medical screening examination/treatment/procedure(s) were performed by non-physician practitioner and as supervising physician I was immediately available for consultation/collaboration.  EKG Interpretation   None         Xue Low, MD 10/10/13 0652 

## 2014-01-08 ENCOUNTER — Encounter (HOSPITAL_COMMUNITY): Payer: Self-pay | Admitting: Emergency Medicine

## 2014-01-08 ENCOUNTER — Other Ambulatory Visit (HOSPITAL_COMMUNITY): Payer: Self-pay | Admitting: Physician Assistant

## 2014-01-08 ENCOUNTER — Emergency Department (HOSPITAL_COMMUNITY)
Admission: EM | Admit: 2014-01-08 | Discharge: 2014-01-08 | Disposition: A | Discharge status: Home / Self Care | Payer: BC Managed Care – PPO | Attending: Emergency Medicine | Admitting: Emergency Medicine

## 2014-01-08 DIAGNOSIS — Z3202 Encounter for pregnancy test, result negative: Secondary | ICD-10-CM | POA: Insufficient documentation

## 2014-01-08 DIAGNOSIS — R110 Nausea: Secondary | ICD-10-CM | POA: Insufficient documentation

## 2014-01-08 DIAGNOSIS — Z791 Long term (current) use of non-steroidal anti-inflammatories (NSAID): Secondary | ICD-10-CM | POA: Insufficient documentation

## 2014-01-08 DIAGNOSIS — Z8619 Personal history of other infectious and parasitic diseases: Secondary | ICD-10-CM | POA: Insufficient documentation

## 2014-01-08 DIAGNOSIS — N644 Mastodynia: Secondary | ICD-10-CM | POA: Insufficient documentation

## 2014-01-08 DIAGNOSIS — Z79899 Other long term (current) drug therapy: Secondary | ICD-10-CM | POA: Insufficient documentation

## 2014-01-08 LAB — PREGNANCY, URINE: Preg Test, Ur: NEGATIVE

## 2014-01-08 NOTE — ED Provider Notes (Signed)
CSN: [id number]     Arrival date & time 01/08/14  0909 History   First MD Initiated Contact with Patient 01/08/14 [phone]     Chief Complaint  Patient presents with  . painful breasts      (Consider location/radiation/quality/duration/timing/severity/associated sxs/prior Treatment) The history is provided by the patient. No language interpreter was used.  Judy Tate is a 28 y/o F with PMHx of chlamydia presenting to the ED with bilateral breast pain for the past 2 months. Patient reported that the pain is intermittent - stated that the discomfort is mainly around the nipple regions described as a sharp, pressure pain. Stated that the breast are tender to the touch and reported that wearing a bra is uncomfortable. Patient reported that she had to increase her breast size. Stated that she has not been using anything for the pain. Stated that the pain stays within her breasts. Reported that she is sexually active, reported that she does use protection, reported that she is currently on Implanon and birth control. Reported that her mother was diagnosed with breast cancer, early stages, reported that she had to get surgery and radiation. Denied fever, chills, neck pain, neck stiffness, vomiting, diarrhea, vomiting, melena, hematochezia, chest pain, shortness of breath, difficulty breathing, dizziness, drainage, redness, warmth to touch, active bleeding from breast, lactation. LMP 12/02/2013. PCP Dr. Senaida Oresichardson  Past Medical History  Diagnosis Date  . Pregnant state, incidental   . H/O chlamydia infection    Past Surgical History  Procedure Laterality Date  . No past surgeries     Family History  Problem Relation Age of Onset  . Diabetes Maternal Grandmother   . Anesthesia problems Neg Hx   . Cancer Mother     breast, colon  . Hypertension Father   . Cancer Maternal Uncle     liver  . Cancer Paternal Grandfather     liver  . Diabetes Cousin    History  Substance Use Topics  .  Smoking status: Never Smoker   . Smokeless tobacco: Never Used  . Alcohol Use: No   OB History   Grav Para Term Preterm Abortions TAB SAB Ect Mult Living   4 3 3  0 1 0 1 0 0 3     Review of Systems  Constitutional: Negative for fever and chills.  Respiratory: Negative for chest tightness and shortness of breath.        Bilateral breast pain  Cardiovascular: Negative for chest pain.  Gastrointestinal: Positive for nausea. Negative for vomiting, abdominal pain, diarrhea, constipation, blood in stool and anal bleeding.  Genitourinary: Negative for dysuria and decreased urine volume.  Musculoskeletal: Negative for back pain and neck pain.  Neurological: Negative for weakness and numbness.  All other systems reviewed and are negative.     Allergies  Review of patient's allergies indicates no known allergies.  Home Medications   Prior to Admission medications   Medication Sig Start Date End Date Taking? Authorizing Provider  guaiFENesin-codeine 100-10 MG/5ML syrup Take 5-10 mLs by mouth 3 (three) times daily as needed for cough. 09/27/13   Tiffany Irine SealG Greene, PA-C  naproxen (NAPROSYN) 500 MG tablet Take 1 tablet (500 mg total) by mouth 2 (two) times daily as needed. 07/09/13   Graylon GoodZachary H Baker, PA-C  traMADol (ULTRAM) 50 MG tablet Take 1 tablet (50 mg total) by mouth every 6 (six) hours as needed for pain. 07/09/13   Adrian BlackwaterZachary H Baker, PA-C   BP 105/54  Pulse 75  Temp(Src) 97.4  F (36.3 C) (Oral)  Resp 16  SpO2 97%  LMP 11/22/2013 Physical Exam  Nursing note and vitals reviewed. Constitutional: She is oriented to person, place, and time. She appears well-developed and well-nourished. No distress.  HENT:  Head: Normocephalic and atraumatic.  Mouth/Throat: Oropharynx is clear and moist. No oropharyngeal exudate.  Eyes: Conjunctivae and EOM are normal. Pupils are equal, round, and reactive to light. Right eye exhibits no discharge. Left eye exhibits no discharge.  Neck: Normal range  of motion. Neck supple. No tracheal deviation present.  Negative neck stiffness Negative nuchal rigidity Negative cervical lymphadenopathy Negative meningeal signs  Cardiovascular: Normal rate, regular rhythm and normal heart sounds.   Pulses:      Radial pulses are 2+ on the right side, and 2+ on the left side.  Cap refill less than 3 seconds  Pulmonary/Chest: Effort normal and breath sounds normal. No respiratory distress. She has no wheezes. She has no rales. She exhibits tenderness (Bilateral breast pain).    Breast exam: Negative swelling, erythema, formation, lesions, sores noted to the breasts bilaterally. Negative asymmetry. Negative puckering. Negative inversion of the nipple. Negative deformities or abnormalities noted to the areola. Negative peu d'orange. Negative palpation of cysts or masses to bilateral breasts. Negative areas of fluctuance or induration palpated to bilateral breasts. Negative active drainage or bleeding noted. Negative warmth upon palpation to the breast. Negative drainage or leakage noted from the nipple blood pressure applied. Negative lymphadenopathy noted. Left breast-discomfort upon palpation to upper and lower outer quadrants upon palpation. Right breast - discomfort upon palpation to upper outer quadrant.  Musculoskeletal: Normal range of motion.  Full ROM to upper and lower extremities without difficulty noted, negative ataxia noted.  Lymphadenopathy:    She has no cervical adenopathy.  Neurological: She is alert and oriented to person, place, and time. No cranial nerve deficit. She exhibits normal muscle tone. Coordination normal.  Cranial nerves III-XII grossly intact Strength 5+/5+ to upper and lower extremities bilaterally with resistance applied, equal distribution noted Gait proper, proper balance - negative sway, negative drift, negative step-offs  Skin: Skin is warm and dry. No rash noted. She is not diaphoretic. No erythema.  Psychiatric: She  has a normal mood and affect. Her behavior is normal. Thought content normal.    ED Course  Procedures (including critical care time)  Labs Review Labs Reviewed  PREGNANCY, URINE    Imaging Review No results found.   EKG Interpretation None      MDM   Final diagnoses:  Breast pain    Filed Vitals:   01/08/14 0915  BP: 105/54  Pulse: 75  Temp: 97.4 F (36.3 C)  TempSrc: Oral  Resp: 16  SpO2: 97%    Patient presenting to the ED with bilateral breast pain that has been ongoing for th epast 2 months described as an intermittent pressure pain with sharp pain. Stated that the pain is localized to the nipple and areola region. Reported that she has not been using anything for the discomfort. Patient reported that the breasts are tender to the touch. Reported that her mother was diagnosed with breast cancer in the early stages and had to undergo surgery and radiation. Reported that her LMP was in March 2015.  Alert and oriented. GCS 15. Heart rate and rhythm normal. Lungs clear to auscultation to upper and lower lobes bilaterally. Radial pulses 2+ bilaterally. Cap refill less than 3 seconds. Negative asymmetry identified to the breast. Negative puckering or inversion of the nipple.  Negative active drainage or bleeding noted from the nipple. Negative signs of erythema, cellulitic infections. Negative warmth upon palpation to the breasts. Discomfort upon palpation to the breasts bilaterally mainly localized to the upper outer quadrants. Negative palpation of masses or lesions. Negative deformities noted. Negative crepitus. Negative palpation lymphadenopathy. Breasts are soft to the touch. Neck supple full range of motion to negative nuchal rigidity or cervical lymphadenopathy noted. Urine pregnancy negative. Doubt mastoiditis. Doubt Paget's disease. Doubt abscess. Negative palpation of masses or cysts-less likely to be fibrocystic breast disease, but cannot be ruled out. Strong history  of breast cancer on maternal side. Discussed with patient breast cancer and plan for US and mammogram as an outpatient. Discussed with patient the dangers and increased risk with strong family history from direct relative. Orders placed for ultrasound and mammogram. Patient stable, afebrile. Discharged patient. Referred patient to primary care provider and OB/GYN. Discussed with patient to followup as soon as possible with breast clinic. Recommended Ibuprofen and warm massages. Discussed with patient to closely monitor symptoms and if symptoms are to worsen or change to report back to the ED - strict return instructions given.  Patient agreed to plan of care, understood, all questions answered.   Raymon MuttonMarissa Brittie Whisnant, PA-C 01/08/14 1724  Judy Forsman, PA-C 01/09/14 2308  Raymon MuttonMarissa Malee Grays, PA-C 01/09/14 2311

## 2014-01-08 NOTE — Discharge Instructions (Signed)
Please call your doctor for a followup appointment within 24-48 hours. When you talk to your doctor please let them know that you were seen in the emergency department and have them acquire all of your records so that they can discuss the findings with you and formulate a treatment plan to fully care for your new and ongoing problems. Please call and set-up an appointment with your primary care provider and OBGYN to be re-assessed within the next 24-48 hours Please call and go to Breast Clinic as soon as possible for imaging to be performed Please rest and stay hydrated Please take Ibuprofen as needed for pain - no more than 1,200 mg per day - please do not take any other anti-inflammatories for this can lead to GI bleed Please avoid any physical or strenuous activity Please continue to monitor symptoms closely and if symptoms are to worsen or change (fever greater than 101, chills, nausea, vomiting, chest pain, shortness of breath, difficulty breathing, neck pain, neck stiffness, nausea, vomiting, diarrhea, numbness or tingling, bleeding or drainage from the breasts, swelling to one breast, enlargement of one breast when compared to the other, warmth to touch, redness, rash to the breast, palpation of a lump or mass to the breast) please report back to the ED immediately   Breast Tenderness Breast tenderness is a common problem for women of all ages. Breast tenderness may cause mild discomfort to severe pain. It has a variety of causes. Your health care provider will find out the likely cause of your breast tenderness by examining your breasts, asking you about symptoms, and ordering some tests. Breast tenderness usually does not mean you have breast cancer. HOME CARE INSTRUCTIONS  Breast tenderness often can be handled at home. You can try:  Getting fitted for a new bra that provides more support, especially during exercise.  Wearing a more supportive bra or sports bra while sleeping when your  breasts are very tender.  If you have a breast injury, apply ice to the area:  Put ice in a plastic bag.  Place a towel between your skin and the bag.  Leave the ice on for 20 minutes, 2 3 times a day.  If your breasts are too full of milk as a result of breastfeeding, try:  Expressing milk either by hand or with a breast pump.  Applying a warm compress to the breasts for relief.  Taking over-the-counter pain relievers, if approved by your health care provider.  Taking other medicines that your health care provider prescribes. These may include antibiotic medicines or birth control pills. Over the long term, your breast tenderness might be eased if you:  Cut down on caffeine.  Reduce the amount of fat in your diet. Keep a log of the days and times when your breasts are most tender. This will help you and your health care provider find the cause of the tenderness and how to relieve it. Also, learn how to do breast exams at home. This will help you notice if you have an unusual growth or lump that could cause tenderness. SEEK MEDICAL CARE IF:   Any part of your breast is hard, red, and hot to the touch. This could be a sign of infection.  Fluid is coming out of your nipples (and you are not breastfeeding). Especially watch for blood or pus.  You have a fever as well as breast tenderness.  You have a new or painful lump in your breast that remains after your menstrual period  ends.  You have tried to take care of the pain at home, but it has not gone away.  Your breast pain is getting worse, or the pain is making it hard to do the things you usually do during your day. Document Released: 08/23/2008 Document Revised: 05/13/2013 Document Reviewed: 04/09/2013 [facility] Patient Information 2014 [place], [place].   Emergency Department Resource Guide 1) Find a Doctor and Pay Out of Pocket Although you won't have to find out who is covered by your insurance plan, it is a good idea to  ask around and get recommendations. You will then need to call the office and see if the doctor you have chosen will accept you as a new patient and what types of options they offer for patients who are self-pay. Some doctors offer discounts or will set up payment plans for their patients who do not have insurance, but you will need to ask so you aren't surprised when you get to your appointment.  2) Contact Your Local Health Department Not all health departments have doctors that can see patients for sick visits, but many do, so it is worth a call to see if yours does. If you don't know where your local health department is, you can check in your phone book. The CDC also has a tool to help you locate your state's health department, and many state websites also have listings of all of their local health departments.  3) Find a Walk-in Clinic If your illness is not likely to be very severe or complicated, you may want to try a walk in clinic. These are popping up all over the country in pharmacies, drugstores, and shopping centers. They're usually staffed by nurse practitioners or physician assistants that have been trained to treat common illnesses and complaints. They're usually fairly quick and inexpensive. However, if you have serious medical issues or chronic medical problems, these are probably not your best option.  No Primary Care Doctor: - Call Health Connect at  [phone] - they can help you locate a primary care doctor that  accepts your insurance, provides certain services, etc. - Physician Referral Service- [phone]  Chronic Pain Problems: Organization         Address  Phone   Notes  [facility]  [phone]) [phone] Patients need to be referred by their primary care doctor.   Medication Assistance: Organization         Address  Phone   Notes  [facility] Medication [facility] [address] [place], [place] [address] [phone] --Must be a  resident of [facility] [place] -- Must have NO insurance coverage whatsoever (no Medicaid/ Medicare, etc.) -- The pt. MUST have a primary care doctor that directs their care regularly and follows them in the community   MedAssist  [phone]   [employer]  [phone]    Agencies that provide inexpensive medical care: Organization         Address  Phone   Notes  [facility]  [phone]) [phone]   [facility]    [phone]   [facility] [address] [place], [place] [address] [phone]   [facility] [address], [place] [phone]) [phone]   Planned Parenthood    [phone]   [facility]    [phone]   [facility]  [address], [place] Phone:  [phone]) [phone],  Fax:  [phone] Hours of Operation:  9 am - 6 pm, M-F.  Also accepts Medicaid/Medicare and self-pay.  [facility] for [address], [address], [place] Phone: [phone], Fax: [phone]. Hours of Operation:  8:30 am - 5:30 pm, M-F.  Also accepts Medicaid and self-pay.  [facility] [address], [place] Phone: [phone]   [place], [place], [place] [phone], Ext. [phone] Mondays & Thursdays: 7-9 AM.  First 15 patients are seen on a first come, first serve basis.    [place] Providers:  Organization         Address  Phone   Notes  [facility] [address], [place] [phone] Also accepts self-pay patients.  [facility] [id number], [place]  [phone]   [place], [address], [place] [phone]   [facility] [address], [place] [phone]   Lucianne Lei [address], [place]   [phone] Only accepts  [place] Access [place] patients after they have their name applied to their card.   Self-Pay (no insurance) in [facility]:  Organization         Address  Phone   Notes  Sickle Cell Patients, [facility] Theresa [phone]   [facility] [place] [phone]   [facility] [place]  [place], [place], [place] [phone]   [facility]/Dr. Osei-Bonsu  [address] [place] or [address], [place] [phone] Phone number for both [place] and [place] locations is the same.  [facility] [address] [place] [phone]   [facility] [address], [place] or [address] [phone]   [facility] [address] [place] [phone], phone; [phone], fax Sees patients 1st and 3rd Saturday of every month.  Must not qualify for public or private insurance (i.e. Medicaid, Medicare, [place] Health Choice, Veterans' Benefits)  Household income should be no more than 200% of the poverty level The clinic cannot treat you if you are pregnant or think you are pregnant  Sexually transmitted diseases are not treated at the clinic.    Dental Care: Organization         Address  Phone  Notes  [facility] Department of [facility] [phone] Accepts children up to age 9 who are enrolled in [place] or [place]; pregnant women with a Medicaid card; and children who have applied for Medicaid or [place] Health Choice, but were declined, whose parents can pay a reduced fee at time of service.  [facility] Department of [facility]  [address], [place] [phone] Accepts children up to age 66 who are enrolled in [place] or [place]; pregnant women  with a Medicaid card; and children who have applied for Medicaid or [place] Health Choice, but were declined, whose parents can pay a reduced fee at time of service.  [facility] Access PROGRAM  [place] [phone] Patients are seen by appointment only. Walk-ins are not  accepted. [place] will see patients 12 years of age and older. Monday - Tuesday (8am-5pm) Most Wednesdays (8:30-5pm) $30 per visit, cash only  [facility] Adult Dental Access PROGRAM  [address], [facility] [phone] Patients are seen by appointment only. Walk-ins are not accepted. [place] will see patients 37 years of age and older. One Wednesday Evening (Monthly: Volunteer Based).  $30 per visit, cash only  [place]  [phone] for adults; Children under age 58, call Graduate Pediatric Dentistry at [phone]. Children aged 82-14, please call [phone] to request a pediatric application.  Dental services are provided in all areas of dental care including fillings, crowns and bridges, complete and partial dentures, implants, gum treatment, root canals, and extractions. Preventive care is also provided. Treatment is provided to both adults and children. Patients are selected via a lottery and there is often a waiting list.   [facility] [address] [place]  [phone] [email or web address]   [facility] [address] [place], [place] [phone], Ext. [phone] Second and Fourth Thursday of each month, opens at 6:30 AM; Clinic ends at 9 AM.  Patients are seen on a first-come first-served basis, and a limited number are seen during each clinic.   [facility]  [address] Hillard Danker [place], [place] [phone]   Eligibility Requirements You must have lived in [place], [place], or [place] counties for at least the last three months.   You cannot be eligible for state or federal sponsored [employer], including [employer], [place], or [employer].   You generally cannot be eligible for healthcare insurance through your employer.    How to apply: Eligibility screenings are held every Tuesday and Wednesday afternoon from 1:00 pm until 4:00 pm. You do not need an appointment for the interview!  [facility] [address], [place], [place]   [place]  [place] Department  [place]  [phone]    Behavioral Health Resources in the Community: Intensive Outpatient Programs Organization         Address  Phone  Notes  Eva [place]. [address], [place], [place] [phone]   [facility] Outpatient [address], [place], [place]   ADS: Alcohol & Drug Svcs [address] [place], [place]   [place] [address],  [place], [place] or [phone]   Substance Abuse Resources Organization         Address  Phone  Notes  Alcohol and Drug Services  [phone]   [place]  [phone]   [facility] [place]   Chinita Pester  [phone]   Residential & Outpatient Substance Abuse Program  [phone]   Psychological Services Organization         Address  Phone  Notes  [facility]  [place]  [phone]   [place] [address], [place] or [phone]    Mobile Crisis Teams Organization         Address  Phone  Notes  [facility], Mobile Crisis Care Unit  [phone]   [facility]  [address] [place], [place]   [facility] [address] [place] [phone]    Self-Help/Support Groups Organization  Address  Phone             Notes  [place]. of [place] - variety of support groups  Clare Call for more information  Narcotics Anonymous (NA), [facility] [address], [employer] [place]  2 meetings at this location   Special educational needs teacher         Address  Phone  Notes  [facility] Residential Treatment [place],    [place]  [phone]   [facility]  [address], [place] [id number], [place], [place]   Innis Cloud [place], [place] [phone] Admissions: 8am-3pm M-F  Incentives Substance [place] [address]    [place], [place] [phone]   [facility] [address] [place], [place], [place]   [facility] [address]  [place], [place]   [facility] Programs - Intensive Outpatient [address], [place], [place]   [facility] ([place].,  [place], [place] [phone] or [phone]   [facility] (RTS) [address], [place], [place] Accepts Medicaid  [facility] [address],  [place] [phone] Substance Abuse/Addiction Treatment   [facility] Organization         Address  Phone  Notes  [facility]  [phone]   Domenic Schwab, PhD [address] Arlis Porta [place], [place]   [phone] or [phone]   [place], [place] [phone]   [facility] [address], [place], [place] [phone] Insurance/Medicaid/sponsorship through [facility] and Families [address], [address] [place]                                    [place], [place] [phone] [place] [address], [place] [phone]    Dr. Adele Schilder  [phone]   [facility] Dept. 1) [address],  [place] 2) [place] 3)  [place] [address], [place] [phone]  [phone]   [place] [phone] or [phone] (After Hours)

## 2014-01-08 NOTE — ED Notes (Addendum)
Pt reports bilateral breast tenderness/soreness for two months. Pt denies discharge, redness, or deformity. Pt reports being seen but "they said nothing was wrong." Pt reports starting birth control during that time. Pt currently on implanon. Pt reports LMP during end of March and last intercourse March 11.

## 2014-01-08 NOTE — ED Notes (Signed)
Per pt, states painful breasts for about 2 months-no discharge, last period the beginning of March-on birth control

## 2014-01-10 NOTE — ED Provider Notes (Signed)
Medical screening examination/treatment/procedure(s) were performed by non-physician practitioner and as supervising physician I was immediately available for consultation/collaboration.   EKG Interpretation None        Tayjah Lobdell M Niasha Devins, MD 01/10/14 2022 

## 2014-01-11 ENCOUNTER — Other Ambulatory Visit (HOSPITAL_COMMUNITY): Payer: Self-pay | Admitting: Physician Assistant

## 2014-01-11 DIAGNOSIS — N644 Mastodynia: Secondary | ICD-10-CM

## 2014-01-12 ENCOUNTER — Ambulatory Visit
Admission: RE | Admit: 2014-01-12 | Discharge: 2014-01-12 | Disposition: A | Discharge status: Home / Self Care | Payer: BC Managed Care – PPO | Source: Ambulatory Visit | Attending: Physician Assistant | Admitting: Physician Assistant

## 2014-01-12 DIAGNOSIS — N644 Mastodynia: Secondary | ICD-10-CM

## 2014-07-26 ENCOUNTER — Encounter (HOSPITAL_COMMUNITY): Payer: Self-pay | Admitting: Emergency Medicine

## 2015-11-18 ENCOUNTER — Emergency Department (HOSPITAL_COMMUNITY)
Admission: EM | Admit: 2015-11-18 | Discharge: 2015-11-19 | Disposition: A | Discharge status: Home / Self Care | Payer: 59 | Attending: Emergency Medicine | Admitting: Emergency Medicine

## 2015-11-18 ENCOUNTER — Encounter (HOSPITAL_COMMUNITY): Payer: Self-pay | Admitting: Oncology

## 2015-11-18 DIAGNOSIS — Z8619 Personal history of other infectious and parasitic diseases: Secondary | ICD-10-CM | POA: Diagnosis not present

## 2015-11-18 DIAGNOSIS — Z3202 Encounter for pregnancy test, result negative: Secondary | ICD-10-CM | POA: Insufficient documentation

## 2015-11-18 DIAGNOSIS — R0602 Shortness of breath: Secondary | ICD-10-CM | POA: Diagnosis not present

## 2015-11-18 DIAGNOSIS — R1010 Upper abdominal pain, unspecified: Secondary | ICD-10-CM | POA: Insufficient documentation

## 2015-11-18 DIAGNOSIS — R109 Unspecified abdominal pain: Secondary | ICD-10-CM | POA: Diagnosis present

## 2015-11-18 LAB — URINALYSIS, ROUTINE W REFLEX MICROSCOPIC
BILIRUBIN URINE: NEGATIVE
Glucose, UA: NEGATIVE mg/dL
Hgb urine dipstick: NEGATIVE
KETONES UR: NEGATIVE mg/dL
Leukocytes, UA: NEGATIVE
Nitrite: NEGATIVE
Protein, ur: NEGATIVE mg/dL
Specific Gravity, Urine: 1.034 — ABNORMAL HIGH (ref 1.005–1.030)
pH: 6.5 (ref 5.0–8.0)

## 2015-11-18 LAB — COMPREHENSIVE METABOLIC PANEL
ALBUMIN: 4.5 g/dL (ref 3.5–5.0)
ALT: 11 U/L — ABNORMAL LOW (ref 14–54)
AST: 18 U/L (ref 15–41)
Alkaline Phosphatase: 53 U/L (ref 38–126)
Anion gap: 9 (ref 5–15)
BUN: 8 mg/dL (ref 6–20)
CHLORIDE: 105 mmol/L (ref 101–111)
CO2: 24 mmol/L (ref 22–32)
Calcium: 9.2 mg/dL (ref 8.9–10.3)
Creatinine, Ser: 0.72 mg/dL (ref 0.44–1.00)
GFR calc Af Amer: >60 mL/min (ref >60)
Glucose, Bld: 99 mg/dL (ref 65–99)
Potassium: 3.7 mmol/L (ref 3.5–5.1)
SODIUM: 138 mmol/L (ref 135–145)
Total Bilirubin: 0.6 mg/dL (ref 0.3–1.2)
Total Protein: 7.4 g/dL (ref 6.5–8.1)

## 2015-11-18 LAB — PREGNANCY, URINE: PREG TEST UR: NEGATIVE

## 2015-11-18 LAB — CBC
HEMATOCRIT: 39 % (ref 36.0–46.0)
Hemoglobin: 13 g/dL (ref 12.0–15.0)
MCH: 28.9 pg (ref 26.0–34.0)
MCHC: 33.3 g/dL (ref 30.0–36.0)
MCV: 86.7 fL (ref 78.0–100.0)
PLATELETS: 302 10*3/uL (ref 150–400)
RBC: 4.5 MIL/uL (ref 3.87–5.11)
RDW: 13.4 % (ref 11.5–15.5)
WBC: 8.5 10*3/uL (ref 4.0–10.5)

## 2015-11-18 LAB — LIPASE, BLOOD: LIPASE: 37 U/L (ref 11–51)

## 2015-11-18 NOTE — ED Notes (Signed)
Pt reports abdominal pain that is intermittent x 1.5 weeks.  Pt states she sometimes becomes Shob w/ the pain however she is walking around the room and speaking in full sentences.  NAD.

## 2015-11-19 ENCOUNTER — Emergency Department (HOSPITAL_COMMUNITY): Payer: 59

## 2015-11-19 MED ORDER — SUCRALFATE 1 GM/10ML PO SUSP: 1.0000 g | Freq: Three times a day (TID) | ORAL | Status: AC

## 2015-11-19 MED ORDER — PANTOPRAZOLE SODIUM 20 MG PO TBEC: 20.0000 mg | DELAYED_RELEASE_TABLET | Freq: Every day | ORAL | Status: AC

## 2015-11-19 NOTE — Discharge Instructions (Signed)

## 2015-11-19 NOTE — ED Notes (Signed)
Discharge instructions, follow up care, and rx x2 reviewed with patient. Patient verbalized understanding. 

## 2015-11-19 NOTE — ED Provider Notes (Signed)
CSN: [id number]     Arrival date & time 11/18/15  2224 History  By signing my name below, I, Terrance Branch, attest that this documentation has been prepared under the direction and in the presence of Gilda Crease, MD. Electronically Signed: Evon Slack, ED Scribe. 11/19/2015. 1:47 AM.    Chief Complaint  Patient presents with  . Abdominal Pain    The history is provided by the patient. No language interpreter was used.   HPI Comments: Judy Tate is a 29 y.o. female who presents to the Emergency Department complaining of intermittent sharp abdominal pain onset 1 week prior. Pt states that recently the pain has become constant. Pt reports intermittent SOB as well due to the pain. Pt denies report any alleviating symptoms. She states that the pain worse worse today while eating and felt as if the pain was radiating into her chest. Pt denies nausea, vomiting, constipation or diarrhea.   Past Medical History  Diagnosis Date  . Pregnant state, incidental   . H/O chlamydia infection    Past Surgical History  Procedure Laterality Date  . No past surgeries     Family History  Problem Relation Age of Onset  . Diabetes Maternal Grandmother   . Anesthesia problems Neg Hx   . Cancer Mother     breast, colon  . Hypertension Father   . Cancer Maternal Uncle     liver  . Cancer Paternal Grandfather     liver  . Diabetes Cousin    Social History  Substance Use Topics  . Smoking status: Never Smoker   . Smokeless tobacco: Never Used  . Alcohol Use: No   OB History    Gravida Para Term Preterm AB TAB SAB Ectopic Multiple Living   0 1 0 1 0 0 3      Review of Systems  Constitutional: Negative for fever.  Respiratory: Positive for shortness of breath.   Gastrointestinal: Positive for abdominal pain. Negative for nausea, vomiting, diarrhea and constipation.  All other systems reviewed and are negative.    Allergies  Review of patient's allergies indicates  no known allergies.  Home Medications   Prior to Admission medications   Medication Sig Start Date End Date Taking? Authorizing Provider  levonorgestrel (MIRENA) 20 MCG/24HR IUD 1 each by Intrauterine route once.   Yes Historical Provider, MD  pantoprazole (PROTONIX) 20 MG tablet Take 1 tablet (20 mg total) by mouth daily. 11/19/15   Gilda Crease, MD  sucralfate (CARAFATE) 1 GM/10ML suspension Take 10 mLs (1 g total) by mouth 4 (four) times daily -  with meals and at bedtime. 11/19/15   Gilda Crease, MD   BP 120/71 mmHg  Pulse 68  Temp(Src) 98 F (36.7 C) (Oral)  Resp 16  SpO2 99%  LMP 10/28/2015 (Approximate)   Physical Exam  Constitutional: She is oriented to person, place, and time. She appears well-developed and well-nourished. No distress.  HENT:  Head: Normocephalic and atraumatic.  Right Ear: Hearing normal.  Left Ear: Hearing normal.  Nose: Nose normal.  Mouth/Throat: Oropharynx is clear and moist and mucous membranes are normal.  Eyes: Conjunctivae and EOM are normal. Pupils are equal, round, and reactive to light.  Neck: Normal range of motion. Neck supple.  Cardiovascular: Regular rhythm, S1 normal and S2 normal.  Exam reveals no gallop and no friction rub.   No murmur heard. Pulmonary/Chest: Effort normal and breath sounds normal. No respiratory distress. She exhibits no  tenderness.  Abdominal: Soft. Normal appearance and bowel sounds are normal. There is no hepatosplenomegaly. There is no tenderness. There is no rebound, no guarding, no tenderness at McBurney's point and negative Murphy's sign. No hernia.  Musculoskeletal: Normal range of motion.  Neurological: She is alert and oriented to person, place, and time. She has normal strength. No cranial nerve deficit or sensory deficit. Coordination normal. GCS eye subscore is 4. GCS verbal subscore is 5. GCS motor subscore is 6.  Skin: Skin is warm, dry and intact. No rash noted. No cyanosis.  Psychiatric:  She has a normal mood and affect. Her speech is normal and behavior is normal. Thought content normal.  Nursing note and vitals reviewed.   ED Course  Procedures (including critical care time) DIAGNOSTIC STUDIES: Oxygen Saturation is 99% on RA, normal by my interpretation.    COORDINATION OF CARE: 1:46 AM-Discussed treatment plan with pt at bedside and pt agreed to plan.    Labs Review Labs Reviewed  COMPREHENSIVE METABOLIC PANEL - Abnormal; Notable for the following:    ALT 11 (*)    All other components within normal limits  URINALYSIS, ROUTINE W REFLEX MICROSCOPIC (NOT AT [facility]) - Abnormal; Notable for the following:    APPearance CLOUDY (*)    Specific Gravity, Urine 1.034 (*)    All other components within normal limits  LIPASE, BLOOD  CBC  PREGNANCY, URINE    Imaging Review US Abdomen Limited Ruq  11/19/2015  CLINICAL DATA:  Right upper quadrant pain for 10 days. EXAM: US ABDOMEN LIMITED - RIGHT UPPER QUADRANT COMPARISON:  None. FINDINGS: Gallbladder: Physiologically distended. No gallstones or wall thickening visualized. No sonographic Murphy sign noted by sonographer. Common bile duct: Diameter: 2 mm. Liver: No focal lesion identified. Within normal limits in parenchymal echogenicity. Normal directional flow in the main portal vein. IMPRESSION: Normal right upper quadrant ultrasound. Electronically Signed   By: Rubye Oaks M.D.   On: 11/19/2015 02:22      EKG Interpretation None      MDM   Final diagnoses:  Upper abdominal pain    Presents complaints of pain in her upper abdomen that has been intermittent for the last 1-1/2 weeks. She reports pain has become more constant today. Pain radiates up into the chest and makes her feel like she is short of breath. Symptoms seem consistent with reflux. Lab work is entirely normal. Examination is unremarkable. Right upper quadrant ultrasound normal, no gallbladder disease. Patient will be treated with Protonix and  Carafate, referral to GI for possible endoscopy.       Gilda Crease, MD 11/19/15 [phone]

## 2015-11-19 NOTE — ED Notes (Signed)
Ultrasound at bedside

## 2015-11-19 NOTE — ED Notes (Signed)
MD at bedside. 

## 2016-03-16 ENCOUNTER — Other Ambulatory Visit: Payer: Self-pay

## 2016-03-16 ENCOUNTER — Encounter (HOSPITAL_COMMUNITY): Payer: Self-pay | Admitting: *Deleted

## 2016-03-16 ENCOUNTER — Emergency Department (HOSPITAL_COMMUNITY)
Admission: EM | Admit: 2016-03-16 | Discharge: 2016-03-16 | Disposition: A | Discharge status: Home / Self Care | Payer: BLUE CROSS/BLUE SHIELD | Attending: Emergency Medicine | Admitting: Emergency Medicine

## 2016-03-16 DIAGNOSIS — R55 Syncope and collapse: Secondary | ICD-10-CM | POA: Insufficient documentation

## 2016-03-16 DIAGNOSIS — R42 Dizziness and giddiness: Secondary | ICD-10-CM | POA: Diagnosis present

## 2016-03-16 LAB — URINE MICROSCOPIC-ADD ON: RBC / HPF: NONE SEEN RBC/hpf (ref 0–5)

## 2016-03-16 LAB — URINALYSIS, ROUTINE W REFLEX MICROSCOPIC
BILIRUBIN URINE: NEGATIVE
Glucose, UA: NEGATIVE mg/dL
Hgb urine dipstick: NEGATIVE
Ketones, ur: NEGATIVE mg/dL
NITRITE: NEGATIVE
PH: 7.5 (ref 5.0–8.0)
Protein, ur: NEGATIVE mg/dL
SPECIFIC GRAVITY, URINE: 1.007 (ref 1.005–1.030)

## 2016-03-16 LAB — BASIC METABOLIC PANEL
ANION GAP: 7 (ref 5–15)
BUN: 8 mg/dL (ref 6–20)
CO2: 26 mmol/L (ref 22–32)
Calcium: 9.3 mg/dL (ref 8.9–10.3)
Chloride: 106 mmol/L (ref 101–111)
Creatinine, Ser: 0.66 mg/dL (ref 0.44–1.00)
GFR calc Af Amer: >60 mL/min (ref >60)
GLUCOSE: 92 mg/dL (ref 65–99)
POTASSIUM: 3.9 mmol/L (ref 3.5–5.1)
Sodium: 139 mmol/L (ref 135–145)

## 2016-03-16 LAB — CBC
HCT: 39.5 % (ref 36.0–46.0)
HEMOGLOBIN: 13.1 g/dL (ref 12.0–15.0)
MCH: 28.4 pg (ref 26.0–34.0)
MCHC: 33.2 g/dL (ref 30.0–36.0)
MCV: 85.5 fL (ref 78.0–100.0)
Platelets: 328 10*3/uL (ref 150–400)
RBC: 4.62 MIL/uL (ref 3.87–5.11)
RDW: 14.1 % (ref 11.5–15.5)
WBC: 8.1 10*3/uL (ref 4.0–10.5)

## 2016-03-16 LAB — HCG, QUANTITATIVE, PREGNANCY: hCG, Beta Chain, Quant, S: <1 m[IU]/mL (ref <5)

## 2016-03-16 LAB — CBG MONITORING, ED: Glucose-Capillary: 83 mg/dL (ref 65–99)

## 2016-03-16 NOTE — Discharge Instructions (Signed)

## 2016-03-16 NOTE — ED Notes (Signed)
Patient aware that a urine sample is needed. Patient will notify staff when able to urinate.  

## 2016-03-16 NOTE — ED Provider Notes (Signed)
CSN: [id number]     Arrival date & time 03/16/16  1642 History   First MD Initiated Contact with Patient 03/16/16 1750     Chief Complaint  Patient presents with  . Dizziness  . Weakness     (Consider location/radiation/quality/duration/timing/severity/associated sxs/prior Treatment) HPI   30 year old female presents for evaluation of near syncope. Patient report around noontime today patient developed sensation of lightheadedness, dizziness with generalized weakness and felt nauseous. Symptoms started after she was walking around at work. At that time she also endorsed some mild midabdominal discomfort which has since resolved. Sensation of dizziness and lightheadedness still persist. She denies having fever, headache, URI symptoms, chest pain, shortness of breath, room spinning sensation, back pain, dysuria, hematuria, vaginal bleeding or vaginal discharge. Denies nausea vomiting or diarrhea. Denies any recent medication change any precipitating symptoms. She ate breakfast and lunch today. Her last menstrual period was 02/23/16. She denies any alcohol abuse or street drug use. Patient is a nonsmoker. No complaint of heart palpitation.  Past Medical History  Diagnosis Date  . Pregnant state, incidental   . H/O chlamydia infection    Past Surgical History  Procedure Laterality Date  . No past surgeries     Family History  Problem Relation Age of Onset  . Diabetes Maternal Grandmother   . Anesthesia problems Neg Hx   . Cancer Mother     breast, colon  . Hypertension Father   . Cancer Maternal Uncle     liver  . Cancer Paternal Grandfather     liver  . Diabetes Cousin    Social History  Substance Use Topics  . Smoking status: Never Smoker   . Smokeless tobacco: Never Used  . Alcohol Use: No   OB History    Gravida Para Term Preterm AB TAB SAB Ectopic Multiple Living   4 3 3  0 1 0 1 0 0 3     Review of Systems  All other systems reviewed and are  negative.     Allergies  Review of patient's allergies indicates no known allergies.  Home Medications   Prior to Admission medications   Medication Sig Start Date End Date Taking? Authorizing Provider  pantoprazole (PROTONIX) 20 MG tablet Take 1 tablet (20 mg total) by mouth daily. Patient not taking: Reported on 03/16/2016 11/19/15   Gilda Creasehristopher J Pollina, MD  sucralfate (CARAFATE) 1 GM/10ML suspension Take 10 mLs (1 g total) by mouth 4 (four) times daily -  with meals and at bedtime. Patient not taking: Reported on 03/16/2016 11/19/15   Gilda Creasehristopher J Pollina, MD   BP 131/72 mmHg  Pulse 70  Temp(Src) 98.2 F (36.8 C) (Oral)  Resp 18  SpO2 98%  LMP 02/23/2016 Physical Exam  Constitutional: She is oriented to person, place, and time. She appears well-developed and well-nourished. No distress.  Obese African-American female laying in bed no acute discomfort.  HENT:  Head: Atraumatic.  Right Ear: External ear normal.  Left Ear: External ear normal.  Mouth/Throat: Oropharynx is clear and moist. No oropharyngeal exudate.  Eyes: Conjunctivae and EOM are normal. Pupils are equal, round, and reactive to light.  Neck: Normal range of motion. Neck supple.  Cardiovascular: Normal rate and regular rhythm.   Pulmonary/Chest: Effort normal and breath sounds normal.  Abdominal: Soft. Bowel sounds are normal. She exhibits no distension. There is no tenderness.  Neurological: She is alert and oriented to person, place, and time. She has normal strength. No cranial nerve deficit or sensory deficit. She  displays a negative Romberg sign. Coordination and gait normal. GCS eye subscore is 4. GCS verbal subscore is 5. GCS motor subscore is 6.  5/5 strength to all 4 extremities.  Skin: No rash noted.  Psychiatric: She has a normal mood and affect.  Nursing note and vitals reviewed.   ED Course  Procedures (including critical care time) Labs Review Labs Reviewed  URINALYSIS, ROUTINE W REFLEX  MI[facility]OSCOPIC (NOT [facility]) - Abnormal; Notable for the following:    Leukocytes, UA MODERATE (*)    All other components within normal limits  URINE MICROSCOPIC-ADD ON - Abnormal; Notable for the following:    Squamous Epithelial / LPF 6-30 (*)    Bacteria, UA RARE (*)    All other components within normal limits  BASIC METABOLIC PANEL  CBC  HCG, QUANTITATIVE, PREGNANCY  CBG MONITORING, ED    Orthostatic Vital Signs HR  Orthostatic Lying  - BP- Lying: 103/66 mmHg ; Pulse- Lying: 77  Orthostatic Sitting - BP- Sitting: 111/71 mmHg ; Pulse- Sitting: 91  Orthostatic Standing at 0 minutes - BP- Standing at 0 minutes: 129/71 mmHg ; Pulse- Standing at 0 minutes: 85      Imaging Review No results found. I have personally reviewed and evaluated these images and lab results as part of my medical decision-making.   EKG Interpretation None     ED ECG REPORT   Date: 03/16/2016  Rate:70  Rhythm: normal sinus rhythm  QRS Axis: normal  Intervals: normal  ST/T Wave abnormalities: nonspecific T wave changes  Conduction Disutrbances:none  Narrative Interpretation:   Old EKG Reviewed: unchanged  I have personally reviewed the EKG tracing and agree with the computerized printout as noted.  MDM   Final diagnoses:  Near syncope    BP 131/72 mmHg  Pulse 70  Temp(Src) 98.2 F (36.8 C) (Oral)  Resp 18  SpO2 98%  LMP 02/23/2016   6:15 PM Patient presents with near syncopal episode. She is well-appearing. No signs of infection. She is afebrile with stable normal vital sign. She did report some mild abdominal tenderness but she has no reproducible abdominal discomfort on exam. Workup initiated.  8:36 PM Labs are reassuring. No evidence of anemia. Normal glucose level. Pregnancy test is negative. Normal orthostatic vital sign.  I have low suspicion for acute emergent medical condition causing her symptom. Encourage watchful waiting and follow-up closely with PCP for further care.  Return precaution discussed.  Fayrene HelperBowie Jeri Jeanbaptiste, PA-C 03/16/16 2052  Benjiman CoreNathan Pickering, MD 03/17/16 [phone]

## 2016-03-16 NOTE — ED Notes (Signed)
Pt complains of lightheadedness, weakness and nausea since 10PM this morning. Pt denies vomiting, diarrhea. States she has 3/10 pain around her umbilical area.

## 2020-01-01 ENCOUNTER — Ambulatory Visit (HOSPITAL_COMMUNITY)
Admission: EM | Admit: 2020-01-01 | Discharge: 2020-01-01 | Disposition: A | Discharge status: Home / Self Care | Payer: Self-pay | Attending: Urgent Care | Admitting: Urgent Care

## 2020-01-01 ENCOUNTER — Encounter (HOSPITAL_COMMUNITY): Payer: Self-pay

## 2020-01-01 ENCOUNTER — Ambulatory Visit (INDEPENDENT_AMBULATORY_CARE_PROVIDER_SITE_OTHER): Payer: Self-pay

## 2020-01-01 ENCOUNTER — Other Ambulatory Visit: Payer: Self-pay

## 2020-01-01 DIAGNOSIS — M545 Low back pain, unspecified: Secondary | ICD-10-CM

## 2020-01-01 DIAGNOSIS — M62830 Muscle spasm of back: Secondary | ICD-10-CM

## 2020-01-01 DIAGNOSIS — Z3202 Encounter for pregnancy test, result negative: Secondary | ICD-10-CM

## 2020-01-01 DIAGNOSIS — R109 Unspecified abdominal pain: Secondary | ICD-10-CM

## 2020-01-01 LAB — POCT URINALYSIS DIP (DEVICE)
Bilirubin Urine: NEGATIVE
Glucose, UA: NEGATIVE mg/dL
Hgb urine dipstick: NEGATIVE
Ketones, ur: NEGATIVE mg/dL
Leukocytes,Ua: NEGATIVE
Nitrite: NEGATIVE
Protein, ur: NEGATIVE mg/dL
Specific Gravity, Urine: >=1.03 (ref 1.005–1.030)
Urobilinogen, UA: 0.2 mg/dL (ref 0.0–1.0)
pH: 6.5 (ref 5.0–8.0)

## 2020-01-01 LAB — POC URINE PREG, ED: Preg Test, Ur: NEGATIVE

## 2020-01-01 LAB — POCT PREGNANCY, URINE: Preg Test, Ur: NEGATIVE

## 2020-01-01 MED ORDER — TIZANIDINE HCL 4 MG PO TABS: 4.0000 mg | ORAL_TABLET | Freq: Four times a day (QID) | ORAL | 0 refills | Status: AC | PRN

## 2020-01-01 MED ORDER — NAPROXEN 500 MG PO TABS: 500.0000 mg | ORAL_TABLET | Freq: Two times a day (BID) | ORAL | 0 refills | Status: AC

## 2020-01-01 NOTE — ED Provider Notes (Signed)
[place]   MRN: [id number] DOB: Jul 17, 1986  Subjective:   Judy Tate is a 34 y.o. female presenting for 1 month hx of persistent intermittent bilateral low back pain worse, radiates to both flank sides at times and toward pelvic area. Pain is severe at times and prevents her from walking. Has used Aleve, otc medication with minimal relief.  Denies fever, dysuria, urinary frequency, hx of kidney stones, hematuria, constipation, falls or trauma. 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 Has been trying to hydrate better, tries to do ~1 gallon a day. Patient has an IUD, does not have cycles. Patient is not particularly active, does not do heavy lifting.   No current facility-administered medications for this encounter.  Current Outpatient Medications:  .  pantoprazole (PROTONIX) 20 MG tablet, Take 1 tablet (20 mg total) by mouth daily. (Patient not taking: Reported on 03/16/2016), Disp: 30 tablet, Rfl: 2 .  sucralfate (CARAFATE) 1 GM/10ML suspension, Take 10 mLs (1 g total) by mouth 4 (four) times daily -  with meals and at bedtime. (Patient not taking: Reported on 03/16/2016), Disp: 420 mL, Rfl: 0   No Known Allergies  Past Medical History:  Diagnosis Date  . H/O chlamydia infection   . Pregnant state, incidental      Past Surgical History:  Procedure Laterality Date  . NO PAST SURGERIES      Family History  Problem Relation Age of Onset  . Diabetes Maternal Grandmother   . Cancer Mother        breast, colon  . Hypertension Father   . Cancer Maternal Uncle        liver  . Cancer Paternal Grandfather        liver  . Diabetes Cousin   . Anesthesia problems Neg Hx     Social History   Tobacco Use  . Smoking status: Never Smoker  . Smokeless tobacco: Never Used  Substance Use Topics  . Alcohol use: No  . Drug use: No    ROS   Objective:   Vitals: BP 102/65   Pulse 61   Temp 98.5 F (36.9  C) (Oral)   Resp 16   Ht 5\' 3"  (1.6 m)   Wt 210 lb (95.3 kg)   SpO2 98%   BMI 37.20 kg/m   Physical Exam Constitutional:      General: She is not in acute distress.    Appearance: Normal appearance. She is well-developed. She is obese. She is not ill-appearing, toxic-appearing or diaphoretic.  HENT:     Head: Normocephalic and atraumatic.     Nose: Nose normal.     Mouth/Throat:     Mouth: Mucous membranes are moist.     Pharynx: Oropharynx is clear.  Eyes:     General: No scleral icterus.    Extraocular Movements: Extraocular movements intact.     Pupils: Pupils are equal, round, and reactive to light.  Cardiovascular:     Rate and Rhythm: Normal rate.  Pulmonary:     Effort: Pulmonary effort is normal.  Abdominal:     General: Bowel sounds are normal. There is no distension.     Palpations: Abdomen is soft. There is no mass.     Tenderness: There is no abdominal tenderness. There is no right CVA tenderness, left CVA tenderness, guarding or rebound.  Musculoskeletal:     Lumbar back: Tenderness (over lumbar paraspinal muscles,  midline over area depicted) present. No swelling, edema, deformity, signs of trauma, lacerations, spasms or bony tenderness. Normal range of motion. Negative right straight leg raise test and negative left straight leg raise test. No scoliosis.       Back:  Skin:    General: Skin is warm and dry.  Neurological:     General: No focal deficit present.     Mental Status: She is alert and oriented to person, place, and time.     Motor: No weakness.     Coordination: Coordination normal.     Gait: Gait normal.     Deep Tendon Reflexes: Reflexes normal.  Psychiatric:        Mood and Affect: Mood normal.        Behavior: Behavior normal.        Thought Content: Thought content normal.        Judgment: Judgment normal.    DG Lumbar Spine Complete  Result Date: 01/01/2020 CLINICAL DATA:  Low back pain EXAM: LUMBAR SPINE - COMPLETE 4+ VIEW COMPARISON:   08/04/2009 FINDINGS: There is no evidence of lumbar spine fracture. Alignment is normal. Intervertebral disc spaces are maintained. IUD in the pelvis. IMPRESSION: Negative. Electronically Signed   By: Jasmine Pang M.D.   On: 01/01/2020 21:01    Results for orders placed or performed during the hospital encounter of 01/01/20 (from the past 24 hour(s))  POCT urinalysis dip (device)     Status: None   Collection Time: 01/01/20  8:05 PM  Result Value Ref Range   Glucose, UA NEGATIVE NEGATIVE mg/dL   Bilirubin Urine NEGATIVE NEGATIVE   Ketones, ur NEGATIVE NEGATIVE mg/dL   Specific Gravity, Urine >=1.030 1.005 - 1.030   Hgb urine dipstick NEGATIVE NEGATIVE   pH 6.5 5.0 - 8.0   Protein, ur NEGATIVE NEGATIVE mg/dL   Urobilinogen, UA 0.2 0.0 - 1.0 mg/dL   Nitrite NEGATIVE NEGATIVE   Leukocytes,Ua NEGATIVE NEGATIVE  POC urine pregnancy     Status: None   Collection Time: 01/01/20  8:16 PM  Result Value Ref Range   Preg Test, Ur NEGATIVE NEGATIVE  Pregnancy, urine POC     Status: None   Collection Time: 01/01/20  8:16 PM  Result Value Ref Range   Preg Test, Ur NEGATIVE NEGATIVE   Assessment and Plan :   1. Acute bilateral low back pain without sciatica   2. Muscle spasm of back   3. Bilateral flank pain     Will use conservative management for her back pain. Use naproxen, tizanidine; hydrate well, discussed back care. Counseled patient on potential for adverse effects with medications prescribed/recommended today, ER and return-to-clinic precautions discussed, patient verbalized understanding.    Wallis Bamberg, [place] 01/01/20 2113

## 2020-01-01 NOTE — ED Triage Notes (Signed)
Pt c/o 9/10 sharp lower back pain that radiates to her pelvic area. Pt denies numbness and tingling. Pt denies loss of bowel or bladder. Pt walked to exam room well.

## 2020-04-21 ENCOUNTER — Ambulatory Visit (HOSPITAL_BASED_OUTPATIENT_CLINIC_OR_DEPARTMENT_OTHER): Admit: 2020-04-21 | Payer: Medicaid Other | Admitting: Obstetrics and Gynecology

## 2020-04-21 ENCOUNTER — Encounter (HOSPITAL_BASED_OUTPATIENT_CLINIC_OR_DEPARTMENT_OTHER): Payer: Self-pay

## 2020-04-21 SURGERY — SALPINGECTOMY, BILATERAL, LAPAROSCOPIC
Anesthesia: General | Laterality: Bilateral
